# Patient Record
Sex: Female | Born: 1975 | Race: White | Hispanic: No | Marital: Married | Smoking: Current every day smoker
Health system: Southern US, Community
[De-identification: ages and names within clinical notes are randomized; demographics above are authoritative.]

## PROBLEM LIST (undated history)

## (undated) DIAGNOSIS — Z1371 Encounter for nonprocreative screening for genetic disease carrier status: Secondary | ICD-10-CM

## (undated) DIAGNOSIS — J3089 Other allergic rhinitis: Secondary | ICD-10-CM

## (undated) DIAGNOSIS — Z8041 Family history of malignant neoplasm of ovary: Secondary | ICD-10-CM

## (undated) DIAGNOSIS — Z9289 Personal history of other medical treatment: Secondary | ICD-10-CM

## (undated) DIAGNOSIS — L918 Other hypertrophic disorders of the skin: Secondary | ICD-10-CM

## (undated) DIAGNOSIS — N938 Other specified abnormal uterine and vaginal bleeding: Secondary | ICD-10-CM

## (undated) DIAGNOSIS — G5600 Carpal tunnel syndrome, unspecified upper limb: Secondary | ICD-10-CM

## (undated) HISTORY — DX: Other specified abnormal uterine and vaginal bleeding: N93.8

## (undated) HISTORY — DX: Other hypertrophic disorders of the skin: L91.8

## (undated) HISTORY — DX: Encounter for nonprocreative screening for genetic disease carrier status: Z13.71

## (undated) HISTORY — DX: Personal history of other medical treatment: Z92.89

## (undated) HISTORY — PX: WISDOM TOOTH EXTRACTION: SHX21

## (undated) HISTORY — DX: Family history of malignant neoplasm of ovary: Z80.41

---

## 2007-11-03 ENCOUNTER — Ambulatory Visit: Payer: Self-pay | Admitting: Internal Medicine

## 2010-01-27 ENCOUNTER — Emergency Department: Payer: Self-pay | Admitting: Emergency Medicine

## 2011-09-03 ENCOUNTER — Ambulatory Visit: Payer: Self-pay | Admitting: Obstetrics & Gynecology

## 2011-09-03 LAB — CBC WITH DIFFERENTIAL/PLATELET
Basophil #: 0 10*3/uL (ref 0.0–0.1)
Basophil %: 0.4 %
Eosinophil #: 0.1 10*3/uL (ref 0.0–0.7)
Eosinophil %: 0.8 %
HGB: 11.4 g/dL — ABNORMAL LOW (ref 12.0–16.0)
Lymphocyte #: 2 10*3/uL (ref 1.0–3.6)
Lymphocyte %: 21 %
MCH: 26.3 pg (ref 26.0–34.0)
MCHC: 32.6 g/dL (ref 32.0–36.0)
MCV: 81 fL (ref 80–100)
Monocyte #: 0.8 10*3/uL — ABNORMAL HIGH (ref 0.0–0.7)
Neutrophil %: 68.7 %
Platelet: 147 10*3/uL — ABNORMAL LOW (ref 150–440)
RDW: 15.4 % — ABNORMAL HIGH (ref 11.5–14.5)
WBC: 9.3 10*3/uL (ref 3.6–11.0)

## 2011-09-04 ENCOUNTER — Inpatient Hospital Stay: Payer: Self-pay | Admitting: Obstetrics and Gynecology

## 2011-09-05 LAB — HEMATOCRIT: HCT: 30.2 % — ABNORMAL LOW (ref 35.0–47.0)

## 2011-09-24 ENCOUNTER — Emergency Department: Payer: Self-pay | Admitting: Emergency Medicine

## 2012-01-07 ENCOUNTER — Ambulatory Visit: Payer: Self-pay

## 2012-10-28 DIAGNOSIS — Z9289 Personal history of other medical treatment: Secondary | ICD-10-CM

## 2012-10-28 HISTORY — DX: Personal history of other medical treatment: Z92.89

## 2013-03-11 ENCOUNTER — Ambulatory Visit: Payer: Self-pay | Admitting: Family Medicine

## 2013-03-11 LAB — URINALYSIS, COMPLETE
Bilirubin,UR: NEGATIVE
Ketone: NEGATIVE
Specific Gravity: 1.01 (ref 1.003–1.030)

## 2013-04-13 ENCOUNTER — Ambulatory Visit: Payer: Self-pay | Admitting: Family Medicine

## 2013-09-21 ENCOUNTER — Ambulatory Visit: Payer: Self-pay | Admitting: Physician Assistant

## 2013-09-21 LAB — URINALYSIS, COMPLETE
Bilirubin,UR: NEGATIVE
GLUCOSE, UR: NEGATIVE mg/dL (ref 0–75)
KETONE: NEGATIVE
Leukocyte Esterase: NEGATIVE
Nitrite: NEGATIVE
PH: 6 (ref 4.5–8.0)
Protein: NEGATIVE
Specific Gravity: 1.025 (ref 1.003–1.030)
WBC UR: NONE SEEN /HPF (ref 0–5)

## 2013-09-22 LAB — URINE CULTURE

## 2014-03-01 ENCOUNTER — Ambulatory Visit: Payer: Self-pay | Admitting: Physician Assistant

## 2014-07-16 ENCOUNTER — Ambulatory Visit: Payer: Self-pay | Admitting: Physician Assistant

## 2014-10-29 NOTE — Op Note (Signed)
PATIENT NAME:  Sharilyn SitesETERS, Sarika A MR#:  [id number] DATE OF BIRTH:  [birth year]  DATE OF PROCEDURE:  09/04/2011  PREOPERATIVE DIAGNOSIS: Term pregnancy, breech presentation.   POSTOPERATIVE DIAGNOSIS: Term pregnancy, breech presentation.   PROCEDURE PERFORMED: Low transverse cesarean section.   SURGEON: Annamarie MajorPaul Ardith Test, M.D.   ASSISTANT: Kate SablePhilip Rosenow, MD  ANESTHESIA: Spinal.   ESTIMATED BLOOD LOSS: 300 mL.   COMPLICATIONS: None.   FINDINGS: Normal tubes, ovaries, and uterus. Viable female infant weighing 9 pounds, 12 ounces with Apgar scores of 9 and 9 at one and five minutes, respectively.   DISPOSITION: To the recovery room in stable condition.  TECHNIQUE: The patient is prepped and draped in the usual sterile fashion after adequate anesthesia is obtained, in the supine position, on the operating room table. A scalpel is used to create a low transverse skin incision that is dissected down to the level of the rectus fascia. This is then dissected bilaterally using Mayo scissors and then the rectus muscles are dissected away from the fascia and then separated in the midline. The peritoneum is penetrated and the bladder is inferiorly dissected and retracted out of harms way. A scalpel is then used to create a low transverse hysterotomy incision that is then extended by blunt dissection. Amniotomy reveals clear fluid. The infant's buttocks are grasped; it is noted to be in the frank breech presentation. They are carefully ease down and out of the incision until the legs are completely delivered. Gentle traction is applied until the axilla is reached and the arms are carefully and gently swept across the chest and delivered. The head is then easily delivered without any complication. The oropharynx is suctioned, and the infant is handed to the pediatric team.   Cord blood is obtained and the placenta is manually extracted. The uterus is externalized and cleansed of all membranes and debris using a  moist sponge. The hysterotomy incision is closed with a running #1 Vicryl suture in a locking fashion with additional sutures placed to obtain excellent hemostasis. The uterus is then placed back in the intra-abdominal cavity and the paracolic gutters are irrigated with warm saline. Reexamination of the incision reveals excellent hemostasis. Interceed is placed over the incision to prevent adhesions.   The peritoneum is closed. The On-Q pain pump is then placed. Trocars are placed through the skin into the subfascial space where the incision is and silver soaker catheters are then placed with syringes attached for bolusing of bupivacaine at a later time. The rectus fascia is closed with 0 Maxon suture with careful placement of sutures to avoid entrapment of the catheters. The catheters are freely movable after placement of this suture. The subcutaneous tissues are irrigated and hemostasis is assured using electrocautery. Skin is closed with a 4-0 Vicryl suture in a subcuticular fashion followed by placement of Dermabond. Dermabond is also used to stabilize the catheters in place. The catheters are flushed with no leakage points noted with bupivacaine 0.5%, 5 mL in each catheter. It is then hooked up to the pain pump. The catheters are stabilized in place using Steri-Strips and a Tegaderm bandage. The patient goes to the recovery room in stable condition. All sponge, instrument, and needle counts are correct.  ____________________________ R. Annamarie MajorPaul Renaldo Gornick, MD rph:slb D: 09/04/2011 08:50:21 ET T: 09/04/2011 09:18:48 ET JOB#: [id number]  cc: Dierdre Searles. Paul Maylen Waltermire, MD, <Dictator> Nadara MustardOBERT P Kimila Papaleo MD ELECTRONICALLY SIGNED 09/04/2011 14:11

## 2014-10-30 LAB — HM PAP SMEAR: HM PAP: NEGATIVE

## 2014-11-17 HISTORY — PX: OTHER SURGICAL HISTORY: SHX169

## 2014-11-27 DIAGNOSIS — L918 Other hypertrophic disorders of the skin: Secondary | ICD-10-CM

## 2014-11-27 HISTORY — DX: Other hypertrophic disorders of the skin: L91.8

## 2015-03-26 ENCOUNTER — Ambulatory Visit
Admission: EM | Admit: 2015-03-26 | Discharge: 2015-03-26 | Disposition: A | Discharge status: Home / Self Care | Payer: BLUE CROSS/BLUE SHIELD | Attending: Family Medicine | Admitting: Family Medicine

## 2015-03-26 ENCOUNTER — Encounter: Payer: Self-pay | Admitting: Emergency Medicine

## 2015-03-26 DIAGNOSIS — L03113 Cellulitis of right upper limb: Secondary | ICD-10-CM

## 2015-03-26 MED ORDER — CEPHALEXIN 500 MG PO CAPS: 500.0000 mg | ORAL_CAPSULE | Freq: Four times a day (QID) | ORAL | Status: DC

## 2015-03-26 NOTE — ED Provider Notes (Signed)
CSN: [id number]     Arrival date & time 03/26/15  1742 History   First MD Initiated Contact with Patient 03/26/15 1837     Chief Complaint  Patient presents with  . Arm Swelling   (Consider location/radiation/quality/duration/timing/severity/associated sxs/prior Treatment) HPI Comments: 39 yo female with a 2 days h/o redness, swelling and pain to right upper arm skin. States 4 days ago received 2 vaccine injections around that area. Denies any fevers, chills, numbness/tingling, drainage.   The history is provided by the patient.    History reviewed. No pertinent past medical history. Past Surgical History  Procedure Laterality Date  . Cesarean section     History reviewed. No pertinent family history. Social History  Substance Use Topics  . Smoking status: Current Every Day Smoker -- 0.50 packs/day    Types: Cigarettes  . Smokeless tobacco: None  . Alcohol Use: Yes   OB History    No data available     Review of Systems  Allergies  Ciprofloxacin-ciproflox hcl er and Sulfa antibiotics  Home Medications   Prior to Admission medications   Medication Sig Start Date End Date Taking? Authorizing Provider  cephALEXin (KEFLEX) 500 MG capsule Take 1 capsule (500 mg total) by mouth 4 (four) times daily. 03/26/15   Payton Mccallum, MD   Meds Ordered and Administered this Visit  Medications - No data to display  BP 107/67 mmHg  Pulse 107  Temp(Src) 98.9 F (37.2 C) (Tympanic)  Resp 18  Ht  (1.676 m)  Wt 143 lb (64.864 kg)  BMI 23.09 kg/m2  SpO2 100% No data found.   Physical Exam  Constitutional: She appears well-developed and well-nourished. No distress.  Neurological: She is alert.  Skin: Rash noted. She is not diaphoretic. There is erythema.  Right upper extremity (mid-upper arm; below the shoulder) skin with blanchable erythema, warmth, and tenderness to palpation; area measures 15x13cm; no drainge or skin lesions/masses; extremity neurovascularly intact   Nursing note and vitals reviewed.   ED Course  Procedures (including critical care time)  Labs Review Labs Reviewed - No data to display  Imaging Review No results found.   Visual Acuity Review  Right Eye Distance:   Left Eye Distance:   Bilateral Distance:    Right Eye Near:   Left Eye Near:    Bilateral Near:         MDM   1. Cellulitis of right upper extremity    Discharge Medication List as of 03/26/2015  6:48 PM    START taking these medications   Details  cephALEXin (KEFLEX) 500 MG capsule Take 1 capsule (500 mg total) by mouth 4 (four) times daily., Starting 03/26/2015, Until Discontinued, Normal      Plan: 1. diagnosis reviewed with patient 2. rx as per orders; risks, benefits, potential side effects reviewed with patient 3. Recommend supportive treatment with warm compresses to affected area 4. F/u prn if symptoms worsen or don't improve    Payton Mccallum, MD 03/26/15 1859

## 2015-03-26 NOTE — ED Notes (Signed)
Patient states she got a pneumonia and flu shot on Friday in her upper right arm and it is swollen, red and painful.

## 2015-03-26 NOTE — Discharge Instructions (Signed)

## 2015-11-20 ENCOUNTER — Other Ambulatory Visit: Payer: Self-pay | Admitting: Obstetrics & Gynecology

## 2015-11-20 DIAGNOSIS — Z1231 Encounter for screening mammogram for malignant neoplasm of breast: Secondary | ICD-10-CM

## 2016-11-03 ENCOUNTER — Ambulatory Visit (INDEPENDENT_AMBULATORY_CARE_PROVIDER_SITE_OTHER): Payer: BLUE CROSS/BLUE SHIELD | Admitting: Obstetrics & Gynecology

## 2016-11-03 ENCOUNTER — Encounter: Payer: Self-pay | Admitting: Obstetrics & Gynecology

## 2016-11-03 ENCOUNTER — Telehealth: Payer: Self-pay | Admitting: Obstetrics & Gynecology

## 2016-11-03 VITALS — BP 110/60 | HR 95 | Ht 66.0 in | Wt 147.0 lb

## 2016-11-03 DIAGNOSIS — Z0000 Encounter for general adult medical examination without abnormal findings: Secondary | ICD-10-CM | POA: Diagnosis not present

## 2016-11-03 DIAGNOSIS — Z3009 Encounter for other general counseling and advice on contraception: Secondary | ICD-10-CM | POA: Diagnosis not present

## 2016-11-03 DIAGNOSIS — Z1231 Encounter for screening mammogram for malignant neoplasm of breast: Secondary | ICD-10-CM

## 2016-11-03 DIAGNOSIS — N921 Excessive and frequent menstruation with irregular cycle: Secondary | ICD-10-CM

## 2016-11-03 DIAGNOSIS — Z1239 Encounter for other screening for malignant neoplasm of breast: Secondary | ICD-10-CM

## 2016-11-03 NOTE — Telephone Encounter (Signed)
Correction: [phone] has been disconnected.

## 2016-11-03 NOTE — Patient Instructions (Signed)
Endometrial Ablation Endometrial ablation is a procedure that destroys the thin inner layer of the lining of the uterus (endometrium). This procedure may be done:  To stop heavy periods.  To stop bleeding that is causing anemia.  To control irregular bleeding.  To treat bleeding caused by small tumors (fibroids) in the endometrium.  This procedure is often an alternative to major surgery, such as removal of the uterus and cervix (hysterectomy). As a result of this procedure:  You may not be able to have children. However, if you are premenopausal (you have not gone through menopause): ? You may still have a small chance of getting pregnant. ? You will need to use a reliable method of birth control after the procedure to prevent pregnancy.  You may stop having a menstrual period, or you may have only a small amount of bleeding during your period. Menstruation may return several years after the procedure.  Tell a health care provider about:  Any allergies you have.  All medicines you are taking, including vitamins, herbs, eye drops, creams, and over-the-counter medicines.  Any problems you or family members have had with the use of anesthetic medicines.  Any blood disorders you have.  Any surgeries you have had.  Any medical conditions you have. What are the risks? Generally, this is a safe procedure. However, problems may occur, including:  A hole (perforation) in the uterus or bowel.  Infection of the uterus, bladder, or vagina.  Bleeding.  Damage to other structures or organs.  An air bubble in the lung (air embolus).  Problems with pregnancy after the procedure.  Failure of the procedure.  Decreased ability to diagnose cancer in the endometrium.  What happens before the procedure?  You will have tests of your endometrium to make sure there are no pre-cancerous cells or cancer cells present.  You may have an ultrasound of the uterus.  You may be given  medicines to thin the endometrium.  Ask your health care provider about: ? Changing or stopping your regular medicines. This is especially important if you take diabetes medicines or blood thinners. ? Taking medicines such as aspirin and ibuprofen. These medicines can thin your blood. Do not take these medicines before your procedure if your doctor tells you not to.  Plan to have someone take you home from the hospital or clinic. What happens during the procedure?  You will lie on an exam table with your feet and legs supported as in a pelvic exam.  To lower your risk of infection: ? Your health care team will wash or sanitize their hands and put on germ-free (sterile) gloves. ? Your genital area will be washed with soap.  An IV tube will be inserted into one of your veins.  You will be given a medicine to help you relax (sedative).  A surgical instrument with a light and camera (resectoscope) will be inserted into your vagina and moved into your uterus. This allows your surgeon to see inside your uterus.  Endometrial tissue will be removed using one of the following methods: ? Radiofrequency. This method uses a radiofrequency-alternating electric current to remove the endometrium. ? Cryotherapy. This method uses extreme cold to freeze the endometrium. ? Heated-free liquid. This method uses a heated saltwater (saline) solution to remove the endometrium. ? Microwave. This method uses high-energy microwaves to heat up the endometrium and remove it. ? Thermal balloon. This method involves inserting a catheter with a balloon tip into the uterus. The balloon tip is   filled with heated fluid to remove the endometrium. The procedure may vary among health care providers and hospitals. What happens after the procedure?  Your blood pressure, heart rate, breathing rate, and blood oxygen level will be monitored until the medicines you were given have worn off.  As tissue healing occurs, you may  notice vaginal bleeding for 4-6 weeks after the procedure. You may also experience: ? Cramps. ? Thin, watery vaginal discharge that is light pink or brown in color. ? A need to urinate more frequently than usual. ? Nausea.  Do not drive for 24 hours if you were given a sedative.  Do not have sex or insert anything into your vagina until your health care provider approves. Summary  Endometrial ablation is done to treat the many causes of heavy menstrual bleeding.  The procedure may be done only after medications have been tried to control the bleeding.  Plan to have someone take you home from the hospital or clinic. This information is not intended to replace advice given to you by your health care provider. Make sure you discuss any questions you have with your health care provider. Document Released: 05/02/2004 Document Revised: 07/10/2016 Document Reviewed: 07/10/2016 Elsevier Interactive Patient Education  2017 [employer]  

## 2016-11-03 NOTE — Progress Notes (Signed)
HPI:      [name] is a 41 y.o. G1P1001 who LMP was Patient's last menstrual period was 10/06/2016., she presents today for her annual examination. The patient has no complaints today. The patient is sexually active. Her 2016 last pap: was normal. The patient does perform self breast exams.  Last MMG 2016 normal.  There is no notable family history of breast or ovarian cancer in her family.  The patient has regular exercise: yes.  The patient denies current symptoms of depression.    GYN History: Contraception: oral progesterone-only contraceptive  PMHx: No past medical history on file. Past Surgical History:  Procedure Laterality Date  . CESAREAN SECTION     Family History  Problem Relation Age of Onset  . Throat cancer Father    Social History  Substance Use Topics  . Smoking status: Current Every Day Smoker    Packs/day: 0.50    Types: Cigarettes  . Smokeless tobacco: Never Used  . Alcohol use Yes    Current Outpatient Prescriptions:  .  calcium carbonate 100 mg/ml SUSP, Take by mouth., Disp: , Rfl:  .  CAMILA 0.35 MG tablet, , Disp: , Rfl:  .  cephALEXin (KEFLEX) 500 MG capsule, Take 1 capsule (500 mg total) by mouth 4 (four) times daily., Disp: 40 capsule, Rfl: 0 Allergies: Azelastine; Ciprofloxacin-ciproflox hcl er; Isosorbide dinitrate er; Sulfa antibiotics; and Ciprofloxacin  Review of Systems  Constitutional: Negative for chills, fever and malaise/fatigue.  HENT: Negative for congestion, sinus pain and sore throat.   Eyes: Negative for blurred vision and pain.  Respiratory: Negative for cough and wheezing.   Cardiovascular: Negative for chest pain and leg swelling.  Gastrointestinal: Negative for abdominal pain, constipation, diarrhea, heartburn, nausea and vomiting.  Genitourinary: Negative for dysuria, frequency, hematuria and urgency.  Musculoskeletal: Negative for back pain, joint pain, myalgias and neck pain.  Skin: Negative for itching and  rash.  Neurological: Negative for dizziness, tremors and weakness.  Endo/Heme/Allergies: Does not bruise/bleed easily.  Psychiatric/Behavioral: Negative for depression. The patient is not nervous/anxious and does not have insomnia.     Objective: BP 110/60   Pulse 95   Ht  (1.676 m)   Wt 147 lb (66.7 kg)   LMP 10/06/2016   BMI 23.73 kg/m   Filed Weights   11/03/16 0807  Weight: 147 lb (66.7 kg)   Body mass index is 23.73 kg/m. Physical Exam  Constitutional: She is oriented to person, place, and time. She appears well-developed and well-nourished. No distress.  Genitourinary: Rectum normal, vagina normal and uterus normal. Pelvic exam was performed with patient supine. There is no rash or lesion on the right labia. There is no rash or lesion on the left labia. Vagina exhibits no lesion. No bleeding in the vagina. Right adnexum does not display mass and does not display tenderness. Left adnexum does not display mass and does not display tenderness. Cervix does not exhibit motion tenderness, lesion, friability or polyp.   Uterus is mobile and midaxial. Uterus is not enlarged or exhibiting a mass.  HENT:  Head: Normocephalic and atraumatic. Head is without laceration.  Right Ear: Hearing normal.  Left Ear: Hearing normal.  Nose: No epistaxis.  No foreign bodies.  Mouth/Throat: Uvula is midline, oropharynx is clear and moist and mucous membranes are normal.  Eyes: Pupils are equal, round, and reactive to light.  Neck: Normal range of motion. Neck supple. No thyromegaly present.  Cardiovascular: Normal rate and regular rhythm.  Exam reveals no gallop and no friction rub.   No murmur heard. Pulmonary/Chest: Effort normal and breath sounds normal. No respiratory distress. She has no wheezes. Right breast exhibits no mass, no skin change and no tenderness. Left breast exhibits no mass, no skin change and no tenderness.  Abdominal: Soft. Bowel sounds are normal. She exhibits no  distension. There is no tenderness. There is no rebound.  Musculoskeletal: Normal range of motion.  Neurological: She is alert and oriented to person, place, and time. No cranial nerve deficit.  Skin: Skin is warm and dry.  Psychiatric: She has a normal mood and affect. Judgment normal.  Vitals reviewed.   Assessment:  ANNUAL EXAM 1. Annual physical exam   2. Consultation for female sterilization   3. Menometrorrhagia   4. Screening for breast cancer      Screening Plan:            1.  Cervical Screening-  Pap smear schedule reviewed with patient, due 2019  2. Breast screening- Exam annually and mammogram>40 planned   3. Colonoscopy every 10 years, Hemoccult testing - after age 54  4. Labs 2016 (and chol repeat 2017) normal/ UTD  5. Counseling for contraception: bilateral tubal ligation,Cont pill for now until procedure  Other:   1. Annual physical exam  2. Consultation for female sterilization Desires BTL along w ablation  3. Menometrorrhagia Treatment option for menorrhagia or menometrorrhagia discussed in great detail with the patient.  Options include hormonal therapy, IUD therapy such as Mirena, D&C, Ablation, and Hysterectomy.  The pros and cons of each option discussed with patient.    F/U  Return in about 1 year (around 11/03/2017) for Annual.  Annamarie Major, MD, [facility], [facility] 11/03/2016  8:42 AM

## 2016-11-03 NOTE — Telephone Encounter (Signed)
-----   Message from Nadara Mustard, MD sent at 11/03/2016  8:44 AM EDT ----- Regarding: surgery Surgery Booking Request Patient Full Name: MARIETA, MARKOV MRN: [id number]  DOB: [birth year]  Surgeon: Letitia Libra, MD  Requested Surgery Date and Time: June 5 Primary Diagnosis and Code: Menometrorrhagia, Desire for sterility Secondary Diagnosis and Code:  Surgical Procedure: Lap Salpingectomy, Hysteroscopy D&C, ABlation L&D Notification: No Admission Status: same day surgery Length of Surgery: 30 Special Case Needs: Novasure H&P:   (date) Phone Interview or Office Pre-Admit: yes Interpreter: no Language: no Medical Clearance: no Special Scheduling Instructions: no

## 2016-11-03 NOTE — Telephone Encounter (Signed)
I attempted to reach the patient, but the phone# listed on her account, [phone], has been disconnected.

## 2016-11-05 NOTE — Telephone Encounter (Signed)
[phone] is still disconnected.

## 2016-11-06 NOTE — Telephone Encounter (Signed)
[phone] is still disconnected.

## 2016-11-06 NOTE — Telephone Encounter (Signed)
Patient's phone# was incorrectly listed with a [phone]. Patient's phone# is [phone]. Patient is aware of H&P on 12/03/16 @ 8:40am, Pre-admit Testing phone interview afterwards, and OR on 12/09/16.

## 2016-11-11 ENCOUNTER — Other Ambulatory Visit: Payer: Self-pay | Admitting: Obstetrics & Gynecology

## 2016-11-11 DIAGNOSIS — Z30011 Encounter for initial prescription of contraceptive pills: Secondary | ICD-10-CM

## 2016-12-03 ENCOUNTER — Ambulatory Visit (INDEPENDENT_AMBULATORY_CARE_PROVIDER_SITE_OTHER): Payer: BLUE CROSS/BLUE SHIELD | Admitting: Obstetrics & Gynecology

## 2016-12-03 ENCOUNTER — Encounter: Payer: Self-pay | Admitting: Obstetrics & Gynecology

## 2016-12-03 ENCOUNTER — Encounter
Admission: RE | Admit: 2016-12-03 | Discharge: 2016-12-03 | Disposition: A | Discharge status: Home / Self Care | Payer: BLUE CROSS/BLUE SHIELD | Source: Ambulatory Visit | Attending: Obstetrics & Gynecology | Admitting: Obstetrics & Gynecology

## 2016-12-03 VITALS — BP 100/60 | HR 82 | Ht 66.0 in | Wt 148.0 lb

## 2016-12-03 DIAGNOSIS — N920 Excessive and frequent menstruation with regular cycle: Secondary | ICD-10-CM

## 2016-12-03 DIAGNOSIS — Z302 Encounter for sterilization: Secondary | ICD-10-CM

## 2016-12-03 HISTORY — DX: Carpal tunnel syndrome, unspecified upper limb: G56.00

## 2016-12-03 NOTE — Patient Instructions (Signed)
  Your procedure is scheduled on: 12-09-16 Report to Same Day Surgery 2nd floor medical mall [facility](Medical Mall Entrance-take elevator on left to 2nd floor.  Check in with surgery information desk.) To find out your arrival time please call [phone] between 1PM - 3PM on 12-08-16  Remember: Instructions that are not followed completely may result in serious medical risk, up to and including death, or upon the discretion of your surgeon and anesthesiologist your surgery may need to be rescheduled.    _x___ 1. Do not eat food or drink liquids after midnight. No gum chewing or   hard candies.     __x__ 2. No Alcohol for 24 hours before or after surgery.   __x__3. No Smoking for 24 prior to surgery.   ____  4. Bring all medications with you on the day of surgery if instructed.    __x__ 5. Notify your doctor if there is any change in your medical condition     (cold, fever, infections).     Do not wear jewelry, make-up, hairpins, clips or nail polish.  Do not wear lotions, powders, or perfumes. You may wear deodorant.  Do not shave 48 hours prior to surgery. Men may shave face and neck.  Do not bring valuables to the hospital.    [facility] is not responsible for any belongings or valuables.               Contacts, dentures or bridgework may not be worn into surgery.  Leave your suitcase in the car. After surgery it may be brought to your room.  For patients admitted to the hospital, discharge time is determined by yourtreatment team.   Patients discharged the day of surgery will not be allowed to drive home.  You will need someone to drive you home and stay with you the night of your procedure.    Please read over the following fact sheets that you were given:   [facility] and or MRSA Information   _x___ Take anti-hypertensive (unless it includes a diuretic), cardiac, seizure, asthma,     anti-reflux and psychiatric medicines. These include:  1.  ZYRTEC  2.  3.  4.  5.  6.  ____Fleets enema or Magnesium Citrate as directed.   ____ Use CHG Soap or sage wipes as directed on instruction sheet   ____ Use inhalers on the day of surgery and bring to hospital day of surgery  ____ Stop Metformin and Janumet 2 days prior to surgery.    ____ Take 1/2 of usual insulin dose the night before surgery and none on the morning  surgery.   ____ Follow recommendations from Cardiologist, Pulmonologist or PCP regarding  stopping Aspirin, Coumadin, Pllavix ,Eliquis, Effient, or Pradaxa, and Pletal.  X____Stop Anti-inflammatories such as Advil, Aleve, Ibuprofen, Motrin, Naproxen, Naprosyn, Goodies powders or aspirin products NOW-OK to take Tylenol.   _x___ Stop supplements until after surgery-STOP [place] MULTI-VITAMIN PACK NOW   ____ Bring C-Pap to the hospital.

## 2016-12-03 NOTE — Patient Instructions (Signed)
Laparoscopic Tubal Ligation Laparoscopic tubal ligation is a procedure to close the fallopian tubes. This is done so that you cannot get pregnant. When the fallopian tubes are closed, the eggs that your ovaries release cannot enter the uterus, and sperm cannot reach the released eggs. A laparoscopic tubal ligation is sometimes called "getting your tubes tied." You should not have this procedure if you want to get pregnant someday or if you are unsure about having more children. Tell a health care provider about:  Any allergies you have.  All medicines you are taking, including vitamins, herbs, eye drops, creams, and over-the-counter medicines.  Any problems you or family members have had with anesthetic medicines.  Any blood disorders you have.  Any surgeries you have had.  Any medical conditions you have.  Whether you are pregnant or may be pregnant.  Any past pregnancies. What are the risks? Generally, this is a safe procedure. However, problems may occur, including:  Infection.  Bleeding.  Injury to surrounding organs.  Side effects from anesthetics.  Failure of the procedure. This procedure can increase your risk of a kind of pregnancy in which a fertilized egg attaches to the outside of the uterus (ectopic pregnancy). What happens before the procedure?  Ask your health care provider about:  Changing or stopping your regular medicines. This is especially important if you are taking diabetes medicines or blood thinners.  Taking medicines such as aspirin and ibuprofen. These medicines can thin your blood. Do not take these medicines before your procedure if your health care provider instructs you not to.  Follow instructions from your health care provider about eating and drinking restrictions.  Plan to have someone take you home after the procedure.  If you go home right after the procedure, plan to have someone with you for 24 hours. What happens during the  procedure?  You will be given one or more of the following:  A medicine to help you relax (sedative).  A medicine to numb the area (local anesthetic).  A medicine to make you fall asleep (general anesthetic).  A medicine that is injected into an area of your body to numb everything below the injection site (regional anesthetic).  An IV tube will be inserted into one of your veins. It will be used to give you medicines and fluids during the procedure.  Your bladder may be emptied with a small tube (catheter).  If you have been given a general anesthetic, a tube will be put down your throat to help you breathe.  Two small cuts (incisions) will be made in your lower abdomen and near your belly button.  Your abdomen will be inflated with a gas. This will let the surgeon see better and will give the surgeon room to work.  A thin, lighted tube (laparoscope) with a camera attached will be inserted into your abdomen through one of the incisions. Small instruments will be inserted through the other incision.  The fallopian tubes will be tied off, burned (cauterized), or blocked with a clip, ring, or clamp. A small portion in the center of each fallopian tube may be removed.  The gas will be released from the abdomen.  The incisions will be closed with stitches (sutures).  A bandage (dressing) will be placed over the incisions. The procedure may vary among health care providers and hospitals. What happens after the procedure?  Your blood pressure, heart rate, breathing rate, and blood oxygen level will be monitored often until the medicines you were   given have worn off.  You will be given medicine to help with pain, nausea, and vomiting as needed. This information is not intended to replace advice given to you by your health care provider. Make sure you discuss any questions you have with your health care provider. Document Released: 09/29/2000 Document Revised: 11/29/2015 Document  Reviewed: 06/03/2015 Elsevier Interactive Patient Education  2017 [employer] Endometrial Ablation Endometrial ablation is a procedure that destroys the thin inner layer of the lining of the uterus (endometrium). This procedure may be done:  To stop heavy periods.  To stop bleeding that is causing anemia.  To control irregular bleeding.  To treat bleeding caused by small tumors (fibroids) in the endometrium. This procedure is often an alternative to major surgery, such as removal of the uterus and cervix (hysterectomy). As a result of this procedure:  You may not be able to have children. However, if you are premenopausal (you have not gone through menopause):  You may still have a small chance of getting pregnant.  You will need to use a reliable method of birth control after the procedure to prevent pregnancy.  You may stop having a menstrual period, or you may have only a small amount of bleeding during your period. Menstruation may return several years after the procedure. Tell a health care provider about:  Any allergies you have.  All medicines you are taking, including vitamins, herbs, eye drops, creams, and over-the-counter medicines.  Any problems you or family members have had with the use of anesthetic medicines.  Any blood disorders you have.  Any surgeries you have had.  Any medical conditions you have. What are the risks? Generally, this is a safe procedure. However, problems may occur, including:  A hole (perforation) in the uterus or bowel.  Infection of the uterus, bladder, or vagina.  Bleeding.  Damage to other structures or organs.  An air bubble in the lung (air embolus).  Problems with pregnancy after the procedure.  Failure of the procedure.  Decreased ability to diagnose cancer in the endometrium. What happens before the procedure?  You will have tests of your endometrium to make sure there are no pre-cancerous cells or cancer cells  present.  You may have an ultrasound of the uterus.  You may be given medicines to thin the endometrium.  Ask your health care provider about:  Changing or stopping your regular medicines. This is especially important if you take diabetes medicines or blood thinners.  Taking medicines such as aspirin and ibuprofen. These medicines can thin your blood. Do not take these medicines before your procedure if your doctor tells you not to.  Plan to have someone take you home from the hospital or clinic. What happens during the procedure?  You will lie on an exam table with your feet and legs supported as in a pelvic exam.  To lower your risk of infection:  Your health care team will wash or sanitize their hands and put on germ-free (sterile) gloves.  Your genital area will be washed with soap.  An IV tube will be inserted into one of your veins.  You will be given a medicine to help you relax (sedative).  A surgical instrument with a light and camera (resectoscope) will be inserted into your vagina and moved into your uterus. This allows your surgeon to see inside your uterus.  Endometrial tissue will be removed using one of the following methods:  Radiofrequency. This method uses a radiofrequency-alternating electric current to remove  the endometrium.  Cryotherapy. This method uses extreme cold to freeze the endometrium.  Heated-free liquid. This method uses a heated saltwater (saline) solution to remove the endometrium.  Microwave. This method uses high-energy microwaves to heat up the endometrium and remove it.  Thermal balloon. This method involves inserting a catheter with a balloon tip into the uterus. The balloon tip is filled with heated fluid to remove the endometrium. The procedure may vary among health care providers and hospitals. What happens after the procedure?  Your blood pressure, heart rate, breathing rate, and blood oxygen level will be monitored until the  medicines you were given have worn off.  As tissue healing occurs, you may notice vaginal bleeding for 4-6 weeks after the procedure. You may also experience:  Cramps.  Thin, watery vaginal discharge that is light pink or brown in color.  A need to urinate more frequently than usual.  Nausea.  Do not drive for 24 hours if you were given a sedative.  Do not have sex or insert anything into your vagina until your health care provider approves. Summary  Endometrial ablation is done to treat the many causes of heavy menstrual bleeding.  The procedure may be done only after medications have been tried to control the bleeding.  Plan to have someone take you home from the hospital or clinic. This information is not intended to replace advice given to you by your health care provider. Make sure you discuss any questions you have with your health care provider. Document Released: 05/02/2004 Document Revised: 07/10/2016 Document Reviewed: 07/10/2016 Elsevier Interactive Patient Education  2017 [employer]

## 2016-12-03 NOTE — Progress Notes (Signed)
PRE-OPERATIVE HISTORY AND PHYSICAL EXAM  HPI:  Deanna Torres is a 41 y.o. G2P1011 No LMP recorded.; she is being admitted for surgery related to abnormal uterine bleeding and requested sterilization.  Periods are reg and heavy, BTB, painful. OCP no help.  Smoker.  Does not desire any further pregnancies.  PMHx: Past Medical History:  Diagnosis Date  . Dysfunctional uterine bleeding   . 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 Hx of mammogram 10/28/2012   Baseline Birad 1   Past Surgical History:  Procedure Laterality Date  . CESAREAN SECTION    . OTHER SURGICAL HISTORY  11/17/2014   vaginal biopsy (fibroepitheal polyp)   Family History  Problem Relation Age of Onset  . Throat cancer Father   . CVA Father   . Lung cancer Father    Social History  Substance Use Topics  . Smoking status: Current Every Day Smoker    Packs/day: 0.50    Types: Cigarettes  . Smokeless tobacco: Never Used  . Alcohol use Yes  Meds- Camila Allergies: Azelastine; Ciprofloxacin-ciproflox hcl er; Isosorbide dinitrate er; Sulfa antibiotics; and Ciprofloxacin  Review of Systems  Constitutional: Negative for chills, fever and malaise/fatigue.  HENT: Negative for congestion, sinus pain and sore throat.   Eyes: Negative for blurred vision and pain.  Respiratory: Negative for cough and wheezing.   Cardiovascular: Negative for chest pain and leg swelling.  Gastrointestinal: Negative for abdominal pain, constipation, diarrhea, heartburn, nausea and vomiting.  Genitourinary: Negative for dysuria, frequency, hematuria and urgency.  Musculoskeletal: Negative for back pain, joint pain, myalgias and neck pain.  Skin: Negative for itching and rash.  Neurological: Negative for dizziness, tremors and weakness.  Endo/Heme/Allergies: Does not bruise/bleed easily.  Psychiatric/Behavioral: Negative for depression. The patient is not nervous/anxious and does not have insomnia.     Objective: BP 100/60   Pulse  82   Ht 5\' 6"  (1.676 m)   Wt 148 lb (67.1 kg)   BMI 23.89 kg/m   Filed Weights   12/03/16 2956  Weight: 148 lb (67.1 kg)   Physical Exam  Constitutional: She is oriented to person, place, and time. She appears well-developed and well-nourished. No distress.  Genitourinary: Rectum normal, vagina normal and uterus normal. Pelvic exam was performed with patient supine. There is no rash or lesion on the right labia. There is no rash or lesion on the left labia. Vagina exhibits no lesion. No bleeding in the vagina. Right adnexum does not display mass and does not display tenderness. Left adnexum does not display mass and does not display tenderness. Cervix does not exhibit motion tenderness, lesion, friability or polyp.   Uterus is mobile and midaxial. Uterus is not enlarged or exhibiting a mass.  HENT:  Head: Normocephalic and atraumatic. Head is without laceration.  Right Ear: Hearing normal.  Left Ear: Hearing normal.  Nose: No epistaxis.  No foreign bodies.  Mouth/Throat: Uvula is midline, oropharynx is clear and moist and mucous membranes are normal.  Eyes: Pupils are equal, round, and reactive to light.  Neck: Normal range of motion. Neck supple. No thyromegaly present.  Cardiovascular: Normal rate and regular rhythm.  Exam reveals no gallop and no friction rub.   No murmur heard. Pulmonary/Chest: Effort normal and breath sounds normal. No respiratory distress. She has no wheezes. Right breast exhibits no mass, no skin change and no tenderness. Left breast exhibits no mass, no skin change and no tenderness.  Abdominal: Soft. Bowel sounds are normal. She exhibits  no distension. There is no tenderness. There is no rebound.  Musculoskeletal: Normal range of motion.  Neurological: She is alert and oriented to person, place, and time. No cranial nerve deficit.  Skin: Skin is warm and dry.  Psychiatric: She has a normal mood and affect. Judgment normal.  Vitals reviewed.   Assessment: 1.  Menorrhagia with regular cycle   2. Admission for sterilization   Plan Lap BTL and Hyst D&C w Ablation  Patient was told that it is normal to have menstrual bleeding after an endometrial ablation, only about 40% of patients become amenorrheic, 50% of patients have normal or light periods, and 10% of patients have no change in their bleeding pattern and may need further intervention.  She was told she will observe her periods for a few months after her ablation to see what her periods will be like; it is recommended to wait until at least three months after the procedure before making conclusions about how periods are going to be like after an ablation.  The patient has been fully informed about all methods of contraception, both temporary and permanent. She understands that tubal ligation is meant to be permanent, absolute and irreversible. She was told that there is an approximately 1 in 400 chance of a pregnancy in the future after tubal ligation. She was told the short and long term complications of tubal ligation. She understands the risks from this surgery include, but are not limited to, the risks of anesthesia, hemorrhage, infection, perforation, and injury to adjacent structures, bowel, bladder and blood vessels.   I have had a careful discussion with this patient about all the options available and the risk/benefits of each. I have fully informed this patient that surgery may subject her to a variety of discomforts and risks: She understands that most patients have surgery with little difficulty, but problems can happen ranging from minor to fatal. These include nausea, vomiting, pain, bleeding, infection, poor healing, hernia, or formation of adhesions. Unexpected reactions may occur from any drug or anesthetic given. Unintended injury may occur to other pelvic or abdominal structures such as Fallopian tubes, ovaries, bladder, ureter (tube from kidney to bladder), or bowel. Nerves going from the  pelvis to the legs may be injured. Any such injury may require immediate or later additional surgery to correct the problem. Excessive blood loss requiring transfusion is very unlikely but possible. Dangerous blood clots may form in the legs or lungs. Physical and sexual activity will be restricted in varying degrees for an indeterminate period of time but most often 2-6 weeks.  Finally, she understands that it is impossible to list every possible undesirable effect and that the condition for which surgery is done is not always cured or significantly improved, and in rare cases may be even worse.Ample time was given to answer all questions.  Annamarie MajorPaul Harris, MD, Merlinda FrederickFACOG [facility], [facility] 12/03/2016  8:31 AM

## 2016-12-09 ENCOUNTER — Ambulatory Visit
Admission: RE | Admit: 2016-12-09 | Discharge: 2016-12-09 | Disposition: A | Discharge status: Home / Self Care | Payer: BLUE CROSS/BLUE SHIELD | Source: Ambulatory Visit | Attending: Obstetrics & Gynecology | Admitting: Obstetrics & Gynecology

## 2016-12-09 ENCOUNTER — Ambulatory Visit: Payer: BLUE CROSS/BLUE SHIELD | Admitting: Anesthesiology

## 2016-12-09 ENCOUNTER — Encounter: Payer: Self-pay | Admitting: Anesthesiology

## 2016-12-09 ENCOUNTER — Encounter
Admission: RE | Disposition: A | Discharge status: Home / Self Care | Payer: Self-pay | Source: Ambulatory Visit | Attending: Obstetrics & Gynecology

## 2016-12-09 DIAGNOSIS — Z302 Encounter for sterilization: Secondary | ICD-10-CM | POA: Insufficient documentation

## 2016-12-09 DIAGNOSIS — F17200 Nicotine dependence, unspecified, uncomplicated: Secondary | ICD-10-CM | POA: Diagnosis not present

## 2016-12-09 DIAGNOSIS — N920 Excessive and frequent menstruation with regular cycle: Secondary | ICD-10-CM | POA: Insufficient documentation

## 2016-12-09 DIAGNOSIS — N838 Other noninflammatory disorders of ovary, fallopian tube and broad ligament: Secondary | ICD-10-CM | POA: Insufficient documentation

## 2016-12-09 HISTORY — PX: LAPAROSCOPIC BILATERAL SALPINGECTOMY: SHX5889

## 2016-12-09 HISTORY — PX: DILITATION & CURRETTAGE/HYSTROSCOPY WITH NOVASURE ABLATION: SHX5568

## 2016-12-09 LAB — CBC
HCT: 42 % (ref 35.0–47.0)
Hemoglobin: 14.1 g/dL (ref 12.0–16.0)
MCH: 27.4 pg (ref 26.0–34.0)
MCHC: 33.5 g/dL (ref 32.0–36.0)
MCV: 81.7 fL (ref 80.0–100.0)
PLATELETS: 224 10*3/uL (ref 150–440)
RBC: 5.14 MIL/uL (ref 3.80–5.20)
RDW: 14.3 % (ref 11.5–14.5)
WBC: 8.4 10*3/uL (ref 3.6–11.0)

## 2016-12-09 LAB — TYPE AND SCREEN
ABO/RH(D): A POS
Antibody Screen: NEGATIVE

## 2016-12-09 LAB — POCT PREGNANCY, URINE: PREG TEST UR: NEGATIVE

## 2016-12-09 SURGERY — SALPINGECTOMY, BILATERAL, LAPAROSCOPIC
Anesthesia: General | Site: Uterus | Wound class: Clean

## 2016-12-09 MED ORDER — SUCCINYLCHOLINE CHLORIDE 20 MG/ML IJ SOLN
INTRAMUSCULAR | Status: DC | PRN
  Administered 2016-12-09: 80 mg via INTRAVENOUS

## 2016-12-09 MED ORDER — FENTANYL CITRATE (PF) 100 MCG/2ML IJ SOLN
INTRAMUSCULAR | Status: AC
  Filled 2016-12-09: qty 2

## 2016-12-09 MED ORDER — FENTANYL CITRATE (PF) 100 MCG/2ML IJ SOLN
INTRAMUSCULAR | Status: DC | PRN
  Administered 2016-12-09: 50 ug via INTRAVENOUS
  Administered 2016-12-09: 100 ug via INTRAVENOUS

## 2016-12-09 MED ORDER — BUPIVACAINE HCL (PF) 0.5 % IJ SOLN
INTRAMUSCULAR | Status: DC | PRN
  Administered 2016-12-09: 8 mL

## 2016-12-09 MED ORDER — LACTATED RINGERS IV SOLN
INTRAVENOUS | Status: DC
  Administered 2016-12-09: 10:00:00 via INTRAVENOUS

## 2016-12-09 MED ORDER — LIDOCAINE HCL (CARDIAC) 20 MG/ML IV SOLN
INTRAVENOUS | Status: DC | PRN
  Administered 2016-12-09: 50 mg via INTRAVENOUS

## 2016-12-09 MED ORDER — ACETAMINOPHEN 325 MG PO TABS: 650.0000 mg | ORAL_TABLET | ORAL | Status: DC | PRN

## 2016-12-09 MED ORDER — ONDANSETRON HCL 4 MG/2ML IJ SOLN
INTRAMUSCULAR | Status: DC | PRN
  Administered 2016-12-09: 4 mg via INTRAVENOUS

## 2016-12-09 MED ORDER — FENTANYL CITRATE (PF) 100 MCG/2ML IJ SOLN
25.0000 ug | INTRAMUSCULAR | Status: DC | PRN
  Administered 2016-12-09 (×4): 25 ug via INTRAVENOUS

## 2016-12-09 MED ORDER — LIDOCAINE HCL (PF) 2 % IJ SOLN
INTRAMUSCULAR | Status: AC
  Filled 2016-12-09: qty 2

## 2016-12-09 MED ORDER — BUPIVACAINE HCL (PF) 0.5 % IJ SOLN
INTRAMUSCULAR | Status: AC
  Filled 2016-12-09: qty 30

## 2016-12-09 MED ORDER — ONDANSETRON HCL 4 MG/2ML IJ SOLN
INTRAMUSCULAR | Status: AC
  Filled 2016-12-09: qty 2

## 2016-12-09 MED ORDER — OXYCODONE-ACETAMINOPHEN 5-325 MG PO TABS: 1.0000 | ORAL_TABLET | ORAL | 0 refills | Status: DC | PRN

## 2016-12-09 MED ORDER — KETOROLAC TROMETHAMINE 30 MG/ML IJ SOLN: 30.0000 mg | Freq: Four times a day (QID) | INTRAMUSCULAR | Status: DC

## 2016-12-09 MED ORDER — PROPOFOL 10 MG/ML IV BOLUS
INTRAVENOUS | Status: AC
  Filled 2016-12-09: qty 20

## 2016-12-09 MED ORDER — LACTATED RINGERS IR SOLN
Status: DC | PRN
  Administered 2016-12-09: 1000 mL

## 2016-12-09 MED ORDER — PROPOFOL 10 MG/ML IV BOLUS
INTRAVENOUS | Status: DC | PRN
Start: 2016-12-09 — End: 2016-12-09
  Administered 2016-12-09: 150 mg via INTRAVENOUS

## 2016-12-09 MED ORDER — SUGAMMADEX SODIUM 200 MG/2ML IV SOLN
INTRAVENOUS | Status: DC | PRN
  Administered 2016-12-09: 135 mg via INTRAVENOUS

## 2016-12-09 MED ORDER — OXYCODONE HCL 5 MG PO TABS: 5.0000 mg | ORAL_TABLET | Freq: Once | ORAL | Status: DC | PRN

## 2016-12-09 MED ORDER — MIDAZOLAM HCL 2 MG/2ML IJ SOLN
INTRAMUSCULAR | Status: DC | PRN
  Administered 2016-12-09: 2 mg via INTRAVENOUS

## 2016-12-09 MED ORDER — SUGAMMADEX SODIUM 200 MG/2ML IV SOLN
INTRAVENOUS | Status: AC
  Filled 2016-12-09: qty 2

## 2016-12-09 MED ORDER — KETOROLAC TROMETHAMINE 30 MG/ML IJ SOLN
INTRAMUSCULAR | Status: AC
  Filled 2016-12-09: qty 1

## 2016-12-09 MED ORDER — OXYCODONE HCL 5 MG/5ML PO SOLN: 5.0000 mg | Freq: Once | ORAL | Status: DC | PRN

## 2016-12-09 MED ORDER — ROCURONIUM BROMIDE 100 MG/10ML IV SOLN
INTRAVENOUS | Status: DC | PRN
  Administered 2016-12-09: 30 mg via INTRAVENOUS

## 2016-12-09 MED ORDER — FAMOTIDINE 20 MG PO TABS
20.0000 mg | ORAL_TABLET | Freq: Once | ORAL | Status: AC
  Administered 2016-12-09: 20 mg via ORAL

## 2016-12-09 MED ORDER — KETOROLAC TROMETHAMINE 30 MG/ML IJ SOLN
INTRAMUSCULAR | Status: DC | PRN
  Administered 2016-12-09: 30 mg via INTRAVENOUS

## 2016-12-09 MED ORDER — DEXAMETHASONE SODIUM PHOSPHATE 10 MG/ML IJ SOLN
INTRAMUSCULAR | Status: DC | PRN
  Administered 2016-12-09: 4 mg via INTRAVENOUS

## 2016-12-09 MED ORDER — MIDAZOLAM HCL 2 MG/2ML IJ SOLN
INTRAMUSCULAR | Status: AC
  Filled 2016-12-09: qty 2

## 2016-12-09 MED ORDER — ACETAMINOPHEN 650 MG RE SUPP
650.0000 mg | RECTAL | Status: DC | PRN
  Filled 2016-12-09: qty 1

## 2016-12-09 MED ORDER — MORPHINE SULFATE (PF) 4 MG/ML IV SOLN: 1.0000 mg | INTRAVENOUS | Status: DC | PRN

## 2016-12-09 SURGICAL SUPPLY — 45 items
BLADE SURG SZ11 CARB STEEL (BLADE) ×4 IMPLANT
CANISTER SUCT 1200ML W/VALVE (MISCELLANEOUS) ×4 IMPLANT
CANISTER SUCT 3000ML PPV (MISCELLANEOUS) ×4 IMPLANT
CATH ROBINSON RED A/P 16FR (CATHETERS) ×4 IMPLANT
CHLORAPREP W/TINT 26ML (MISCELLANEOUS) ×4 IMPLANT
DERMABOND ADVANCED (GAUZE/BANDAGES/DRESSINGS) ×2
DERMABOND ADVANCED .7 DNX12 (GAUZE/BANDAGES/DRESSINGS) ×2 IMPLANT
DRSG TEGADERM 2-3/8X2-3/4 SM (GAUZE/BANDAGES/DRESSINGS) ×4 IMPLANT
DRSG TELFA 4X3 1S NADH ST (GAUZE/BANDAGES/DRESSINGS) IMPLANT
ENDOPOUCH RETRIEVER 10 (MISCELLANEOUS) IMPLANT
GAUZE SPONGE NON-WVN 2X2 STRL (MISCELLANEOUS) IMPLANT
GLOVE BIO SURGEON STRL SZ8 (GLOVE) ×4 IMPLANT
GLOVE INDICATOR 8.0 STRL GRN (GLOVE) ×4 IMPLANT
GOWN STRL REUS W/ TWL LRG LVL3 (GOWN DISPOSABLE) ×2 IMPLANT
GOWN STRL REUS W/ TWL XL LVL3 (GOWN DISPOSABLE) ×2 IMPLANT
GOWN STRL REUS W/TWL LRG LVL3 (GOWN DISPOSABLE) ×2
GOWN STRL REUS W/TWL XL LVL3 (GOWN DISPOSABLE) ×2
GRASPER SUT TROCAR 14GX15 (MISCELLANEOUS) ×4 IMPLANT
IRRIGATION STRYKERFLOW (MISCELLANEOUS) IMPLANT
IRRIGATOR STRYKERFLOW (MISCELLANEOUS)
IV LACTATED RINGERS 1000ML (IV SOLUTION) ×4 IMPLANT
KIT PINK PAD W/HEAD ARE REST (MISCELLANEOUS) ×4
KIT PINK PAD W/HEAD ARM REST (MISCELLANEOUS) ×2 IMPLANT
LABEL OR SOLS (LABEL) ×4 IMPLANT
NEEDLE VERESS 14GA 120MM (NEEDLE) ×4 IMPLANT
NOVASURE ENDOMETRIAL ABLATION (MISCELLANEOUS) ×4 IMPLANT
NS IRRIG 500ML POUR BTL (IV SOLUTION) ×4 IMPLANT
PACK DNC HYST (MISCELLANEOUS) ×4 IMPLANT
PACK GYN LAPAROSCOPIC (MISCELLANEOUS) ×4 IMPLANT
PAD OB MATERNITY 4.3X12.25 (PERSONAL CARE ITEMS) ×4 IMPLANT
PAD PREP 24X41 OB/GYN DISP (PERSONAL CARE ITEMS) ×4 IMPLANT
SCISSORS METZENBAUM CVD 33 (INSTRUMENTS) IMPLANT
SHEARS HARMONIC ACE PLUS 36CM (ENDOMECHANICALS) IMPLANT
SLEEVE ENDOPATH XCEL 5M (ENDOMECHANICALS) IMPLANT
SPONGE VERSALON 2X2 STRL (MISCELLANEOUS)
STRAP SAFETY BODY (MISCELLANEOUS) ×4 IMPLANT
SUT VIC AB 0 CT1 36 (SUTURE) ×4 IMPLANT
SUT VIC AB 2-0 UR6 27 (SUTURE) IMPLANT
SUT VIC AB 4-0 PS2 18 (SUTURE) IMPLANT
SYRINGE 10CC LL (SYRINGE) ×4 IMPLANT
TROCAR ENDO BLADELESS 11MM (ENDOMECHANICALS) IMPLANT
TROCAR XCEL NON-BLD 5MMX100MML (ENDOMECHANICALS) ×4 IMPLANT
TUBING CONNECTING 10 (TUBING) ×3 IMPLANT
TUBING CONNECTING 10' (TUBING) ×1
TUBING INSUFFLATOR HI FLOW (MISCELLANEOUS) ×4 IMPLANT

## 2016-12-09 NOTE — OR Nursing (Signed)
Patient denies any need for pain meds desires discharge.

## 2016-12-09 NOTE — Anesthesia Preprocedure Evaluation (Signed)
Anesthesia Evaluation  Patient identified by MRN, date of birth, ID band Patient awake    Reviewed: Allergy & Precautions, H&P , NPO status , Patient's Chart, lab work & pertinent test results  Airway Mallampati: III  TM Distance: >3 FB Neck ROM: full    Dental  (+) Chipped   Pulmonary neg shortness of breath, Current Smoker,    Pulmonary exam normal breath sounds clear to auscultation       Cardiovascular Exercise Tolerance: Good (-) angina(-) Past MI and (-) DOE negative cardio ROS Normal cardiovascular exam Rhythm:regular Rate:Normal     Neuro/Psych  Neuromuscular disease negative psych ROS   GI/Hepatic negative GI ROS, Neg liver ROS,   Endo/Other  negative endocrine ROS  Renal/GU      Musculoskeletal   Abdominal   Peds  Hematology negative hematology ROS (+)   Anesthesia Other Findings Past Medical History: No date: Carpal tunnel syndrome     Comment: RIGHT  No date: Dysfunctional uterine bleeding 11/27/2014: 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: Hx of mammogram     Comment: Baseline Birad 1  Past Surgical History: No date: CESAREAN SECTION 11/17/2014: OTHER SURGICAL HISTORY     Comment: vaginal biopsy (fibroepitheal polyp) No date: WISDOM TOOTH EXTRACTION  BMI    Body Mass Index:  23.89 kg/m      Reproductive/Obstetrics negative OB ROS                             Anesthesia Physical Anesthesia Plan  ASA: III  Anesthesia Plan: General ETT   Post-op Pain Management:    Induction: Intravenous  PONV Risk Score and Plan: 4 or greater and Ondansetron, Dexamethasone, Propofol, Midazolam and Treatment may vary due to age  Airway Management Planned: Oral ETT  Additional Equipment:   Intra-op Plan:   Post-operative Plan: Extubation in OR  Informed Consent: I have reviewed the patients History and Physical, chart, labs and discussed the procedure including the risks,  benefits and alternatives for the proposed anesthesia with the patient or authorized representative who has indicated his/her understanding and acceptance.   Dental Advisory Given  Plan Discussed with: Anesthesiologist, CRNA and Surgeon  Anesthesia Plan Comments: (Patient consented for risks of anesthesia including but not limited to:  - adverse reactions to medications - damage to teeth, lips or other oral mucosa - sore throat or hoarseness - Damage to heart, brain, lungs or loss of life  Patient voiced understanding.)        Anesthesia Quick Evaluation

## 2016-12-09 NOTE — Anesthesia Postprocedure Evaluation (Signed)
Anesthesia Post Note  Patient: Deanna Torres  Procedure(s) Performed: Procedure(s) (LRB): LAPAROSCOPIC BILATERAL SALPINGECTOMY (Bilateral) DILATATION & CURETTAGE/HYSTEROSCOPY WITH NOVASURE ABLATION (N/A)  Patient location during evaluation: PACU Anesthesia Type: General Level of consciousness: awake and alert Pain management: pain level controlled Vital Signs Assessment: post-procedure vital signs reviewed and stable Respiratory status: spontaneous breathing, nonlabored ventilation, respiratory function stable and patient connected to nasal cannula oxygen Cardiovascular status: blood pressure returned to baseline and stable Postop Assessment: no signs of nausea or vomiting Anesthetic complications: no     Last Vitals:  Vitals:   12/09/16 1243 12/09/16 1258  BP: 112/69 114/79  Pulse: 83 65  Resp: 11 12  Temp:  36.9 C    Last Pain:  Vitals:   12/09/16 1258  TempSrc:   PainSc: 2                  Cleda MccreedyJoseph K Piscitello

## 2016-12-09 NOTE — Transfer of Care (Signed)
Immediate Anesthesia Transfer of Care Note  Patient: Deanna SitesMargaret A Grissett  Procedure(s) Performed: Procedure(s): LAPAROSCOPIC BILATERAL SALPINGECTOMY (Bilateral) DILATATION & CURETTAGE/HYSTEROSCOPY WITH NOVASURE ABLATION (N/A)  Patient Location: PACU  Anesthesia Type:General  Level of Consciousness: sedated  Airway & Oxygen Therapy: Patient Spontanous Breathing and Patient connected to face mask oxygen  Post-op Assessment: Report given to RN and Post -op Vital signs reviewed and stable  Post vital signs: Reviewed and stable  Last Vitals:  Vitals:   12/09/16 0906 12/09/16 1158  BP: 107/75 113/66  Pulse: 70 62  Resp:  (!) 8  Temp: 36.7 C     Last Pain:  Vitals:   12/09/16 0906  TempSrc: Oral         Complications: No apparent anesthesia complications

## 2016-12-09 NOTE — Anesthesia Post-op Follow-up Note (Cosign Needed)
Anesthesia QCDR form completed.        

## 2016-12-09 NOTE — Op Note (Signed)
Operative Note   12/09/2016  PRE-OP DIAGNOSIS: Menorrhagia, Sterilization   POST-OP DIAGNOSIS: same   SURGEON: Annamarie MajorPaul Carlton Buskey, MD, FACOG   PROCEDURE: Procedure(s): LAPAROSCOPIC BILATERAL SALPINGECTOMY DILATATION & CURETTAGE/HYSTEROSCOPY WITH NOVASURE ABLATION  ANESTHESIA: General   ESTIMATED BLOOD LOSS: min   SPECIMENS: tubes  FLUID DEFICIT: min   COMPLICATIONS: none   DISPOSITION: PACU - hemodynamically stable.   CONDITION: stable   FINDINGS: Exam under anesthesia revealed small, mobile small uterus with no masses and bilateral adnexa without masses or fullness. Hysteroscopy revealed no lesions or polyps, otherwise grossly normal appearing uterine cavity with bilateral tubal ostia and normal appearing endocervical canal. Laparoscopy revealed normal tubes, ovaries, uterus.  PROCEDURE IN DETAIL: After informed consent was obtained, the patient was taken to the operating room where anesthesia was obtained without difficulty. The patient was positioned in the dorsal lithotomy position in [place] stirrups. The patient's bladder was catheterized with an in and out foley catheter. The patient was examined under anesthesia, with the above noted findings. The weightedspeculum was placed inside the patient's vagina, and the the anterior lip of the cervix was seen and grasped with the tenaculum.  The uterine cavity was sounded to 7cm and a Hulka Tenaculum was placed.  Attention is then turned to the abdomen where a veress needle is placed through an infraumbilical incision after Marcaine is used to anesthesize the skin.  Veress needle placement is confirmed using the hanging drop technique and the abdomen is then insufflated w CO2 gas.  A 5mm trocar is then placed under direct visualization with the laparoscope and the above mentioned findings seen.  An additional 5mm  trocar is placed RLQ.  The left fallopian tube is grasped and dissected free using the Harmonic scapel along the mesosalpinx with  preservation of the main ovarian arterial blood flow to the ovary.  The same is performed on the right.   Hemostasis is assured.  Gas is expelled and trocars removed with skin closure using Dermabond.  Return to vaginal approach.  The cervix was progressively dilated to a 18 French-Pratt dilator. The 30 degree hysteroscope was introduced, with LR fluid used to distend the intrauterine cavity, with the above noted findings. Hysteroscope removed.    The NovaSure device was then placed without difficulty. Measurements were obtained. Patient was noted to have a uterine length of 7cm, a cervical length of 2 cm, and a cervical width of 2.5 cm. The NovaSure device is first tested and after confirmation the procedures performed. Length of procedure was 71 seconds. Power 69. The NovaSure device is then removed and repeat hysteroscopy reveals an appropriate lining of the uterus and no perforation or injury. Hysteroscope is removed with minimal discrepancy of fluid.    Tenaculum was removed with excellent hemostasis noted. She was then taken out of dorsal lithotomy. Minimal discrepancy in fluid was noted. No bleeding.  The patient tolerated the procedure well. Sponge, lap and needle counts were correct x2. The patient was taken to recovery room in excellent condition.

## 2016-12-09 NOTE — H&P (Signed)
History and Physical Interval Note:  12/09/2016 10:24 AM  Deanna SitesMargaret A Berryman  has presented today for surgery, with the diagnosis of MENOMETRORRHAGIA,DESIRE FOR STERILITY  The various methods of treatment have been discussed with the patient and family. After consideration of risks, benefits and other options for treatment, the patient has consented to  Procedure(s): LAPAROSCOPIC BILATERAL SALPINGECTOMY (Bilateral) DILATATION & CURETTAGE/HYSTEROSCOPY WITH NOVASURE ABLATION (N/A) as a surgical intervention .  The patient's history has been reviewed, patient examined, no change in status, stable for surgery.  Pt has the following beta blocker history-  Not taking Beta Blocker.  I have reviewed the patient's chart and labs.  Questions were answered to the patient's satisfaction.    Letitia Libraobert Paul Seif Teichert

## 2016-12-09 NOTE — Anesthesia Procedure Notes (Signed)
Procedure Name: Intubation Performed by: Lance Muss Pre-anesthesia Checklist: Patient identified, Patient being monitored, Timeout performed, Emergency Drugs available and Suction available Patient Re-evaluated:Patient Re-evaluated prior to inductionOxygen Delivery Method: Circle system utilized Preoxygenation: Pre-oxygenation with 100% oxygen Intubation Type: IV induction Ventilation: Mask ventilation without difficulty Laryngoscope Size: Mac and 3 Grade View: Grade II Tube type: Oral Tube size: 7.0 mm Number of attempts: 1 Airway Equipment and Method: Stylet Placement Confirmation: ETT inserted through vocal cords under direct vision,  positive ETCO2 and breath sounds checked- equal and bilateral Secured at: 21 cm Tube secured with: Tape Dental Injury: Teeth and Oropharynx as per pre-operative assessment

## 2016-12-09 NOTE — Discharge Instructions (Signed)
AMBULATORY SURGERY  DISCHARGE INSTRUCTIONS   1) The drugs that you were given will stay in your system until tomorrow so for the next 24 hours you should not:  A) Drive an automobile B) Make any legal decisions C) Drink any alcoholic beverage   2) You may resume regular meals tomorrow.  Today it is better to start with liquids and gradually work up to solid foods.  You may eat anything you prefer, but it is better to start with liquids, then soup and crackers, and gradually work up to solid foods.   3) Please notify your doctor immediately if you have any unusual bleeding, trouble breathing, redness and pain at the surgery site, drainage, fever, or pain not relieved by medication.    4) Additional Instructions:        Please contact your physician with any problems or Same Day Surgery at [phone], Monday through Friday 6 am to 4 pm, or [place] at [facility] Main number at [phone].Diagnostic Laparoscopy, Care After Refer to this sheet in the next few weeks. These instructions provide you with information about caring for yourself after your procedure. Your health care provider may also give you more specific instructions. Your treatment has been planned according to current medical practices, but problems sometimes occur. Call your health care provider if you have any problems or questions after your procedure. What can I expect after the procedure? After your procedure, it is common to have mild discomfort in the throat and abdomen. Follow these instructions at home:  Take over-the-counter and prescription medicines only as told by your health care provider.  Do not drive for 24 hours if you received a sedative.  Return to your normal activities as told by your health care provider.  Do not take baths, swim, or use a hot tub until your health care provider approves. You may shower.  Follow instructions from your health care provider about how to take care of your  incision. Make sure you: ? Wash your hands with soap and water before you change your bandage (dressing). If soap and water are not available, use hand sanitizer. ? Change your dressing WEDNESDAY, leave uncovered. ? Leave stitches (sutures), skin glue, or adhesive strips in place. These skin closures may need to stay in place for 2 weeks or longer. If adhesive strip edges start to loosen and curl up, you may trim the loose edges. Do not remove adhesive strips completely unless your health care provider tells you to do that.  Check your incision area every day for signs of infection. Check for: ? More redness, swelling, or pain. ? More fluid or blood. ? Warmth. ? Pus or a bad smell.  It is your responsibility to get the results of your procedure. Ask your health care provider or the department performing the procedure when your results will be ready. Contact a health care provider if:  There is new pain in your shoulders.  You feel light-headed or faint.  You are unable to pass gas or unable to have a bowel movement.  You feel nauseous or you vomit.  You develop a rash.  You have more redness, swelling, or pain around your incision.  You have more fluid or blood coming from your incision.  Your incision feels warm to the touch.  You have pus or a bad smell coming from your incision.  You have a fever or chills. Get help right away if:  Your pain is getting worse.  You have ongoing  vomiting.  The edges of your incision open up.  You have trouble breathing.  You have chest pain.   Hysteroscopy, Care After Refer to this sheet in the next few weeks. These instructions provide you with information on caring for yourself after your procedure. Your health care provider may also give you more specific instructions. Your treatment has been planned according to current medical practices, but problems sometimes occur. Call your health care provider if you have any problems or  questions after your procedure. What can I expect after the procedure? After your procedure, it is typical to have the following:  You may have some cramping. This normally lasts for a couple days.  You may have bleeding. This can vary from light spotting for a few days to menstrual-like bleeding for 3-7 days.  Follow these instructions at home:  Rest for the first 1-2 days after the procedure.  Only take over-the-counter or prescription medicines as directed by your health care provider. Do not take aspirin. It can increase the chances of bleeding.  Take showers instead of baths for 2 weeks or as directed by your health care provider.  Do not drive for 24 hours or as directed.  Do not drink alcohol while taking pain medicine.  Do not use tampons, douche, or have sexual intercourse for 2 weeks or until your health care provider says it is okay.  Take your temperature twice a day for 4-5 days. Write it down each time.  Follow your health care provider's advice about diet, exercise, and lifting.  If you develop constipation, you may: ? Take a mild laxative if your health care provider approves. ? Add bran foods to your diet. ? Drink enough fluids to keep your urine clear or pale yellow.  Try to have someone with you or available to you for the first 24-48 hours, especially if you were given a general anesthetic.  Follow up with your health care provider as directed. Contact a health care provider if:  You feel dizzy or lightheaded.  You feel sick to your stomach (nauseous).  You have abnormal vaginal discharge.  You have a rash.  You have pain that is not controlled with medicine. Get help right away if:  You have bleeding that is heavier than a normal menstrual period.  You have a fever.  You have increasing cramps or pain, not controlled with medicine.  You have new belly (abdominal) pain.  You pass out.  You have pain in the tops of your shoulders (shoulder  strap areas).  You have shortness of breath. This information is not intended to replace advice given to you by your health care provider. Make sure you discuss any questions you have with your health care provider. Document Released: 04/13/2013 Document Revised: 11/29/2015 Document Reviewed: 01/20/2013 Elsevier Interactive Patient Education  2017 [employer].

## 2016-12-10 ENCOUNTER — Encounter: Payer: Self-pay | Admitting: Obstetrics & Gynecology

## 2016-12-10 LAB — SURGICAL PATHOLOGY

## 2016-12-13 ENCOUNTER — Encounter: Payer: Self-pay | Admitting: Obstetrics & Gynecology

## 2016-12-24 ENCOUNTER — Encounter: Payer: Self-pay | Admitting: Obstetrics & Gynecology

## 2016-12-24 ENCOUNTER — Ambulatory Visit (INDEPENDENT_AMBULATORY_CARE_PROVIDER_SITE_OTHER): Payer: BLUE CROSS/BLUE SHIELD | Admitting: Obstetrics & Gynecology

## 2016-12-24 VITALS — BP 100/60 | HR 79 | Ht 66.0 in | Wt 148.0 lb

## 2016-12-24 DIAGNOSIS — Z302 Encounter for sterilization: Secondary | ICD-10-CM

## 2016-12-24 DIAGNOSIS — N920 Excessive and frequent menstruation with regular cycle: Secondary | ICD-10-CM

## 2016-12-24 NOTE — Progress Notes (Signed)
  Postoperative Follow-up Patient presents post op from ABlation and Tubal for abnormal uterine bleeding and requested sterilization, 2 weeks ago.  Subjective: Patient reports marked improvement in her preop symptoms. Eating a regular diet without difficulty. The patient is not having any pain.  Activity: normal activities of daily living. Patient reports vaginal sx's of None  Objective: BP 100/60   Pulse 79   Ht 5\' 6"  (1.676 m)   Wt 148 lb (67.1 kg)   BMI 23.89 kg/m  Physical Exam  Constitutional: She is oriented to person, place, and time. She appears well-developed and well-nourished. No distress.  Cardiovascular: Normal rate.   Pulmonary/Chest: Effort normal.  Abdominal: Soft. She exhibits no distension. There is no tenderness.  Incision Healing Well   Musculoskeletal: Normal range of motion.  Neurological: She is alert and oriented to person, place, and time. No cranial nerve deficit.  Skin: Skin is warm and dry.  Psychiatric: She has a normal mood and affect.    Assessment: s/p :  tubal ligation and endometrial ablation stable  Plan: Patient has done well after surgery with no apparent complications.  I have discussed the post-operative course to date, and the expected progress moving forward.  The patient understands what complications to be concerned about.  I will see the patient in routine follow up, or sooner if needed.    Activity plan: No restriction.  Monitor future cycles   Deanna LibraRobert Paul Kaevion Torres 12/24/2016, 10:48 AM

## 2017-01-31 ENCOUNTER — Encounter: Payer: Self-pay | Admitting: Emergency Medicine

## 2017-01-31 ENCOUNTER — Emergency Department
Admission: EM | Admit: 2017-01-31 | Discharge: 2017-01-31 | Disposition: A | Discharge status: Home / Self Care | Payer: BLUE CROSS/BLUE SHIELD | Attending: Emergency Medicine | Admitting: Emergency Medicine

## 2017-01-31 DIAGNOSIS — Z79899 Other long term (current) drug therapy: Secondary | ICD-10-CM | POA: Insufficient documentation

## 2017-01-31 DIAGNOSIS — L509 Urticaria, unspecified: Secondary | ICD-10-CM | POA: Diagnosis not present

## 2017-01-31 DIAGNOSIS — L519 Erythema multiforme, unspecified: Secondary | ICD-10-CM

## 2017-01-31 DIAGNOSIS — F17210 Nicotine dependence, cigarettes, uncomplicated: Secondary | ICD-10-CM | POA: Insufficient documentation

## 2017-01-31 DIAGNOSIS — R21 Rash and other nonspecific skin eruption: Secondary | ICD-10-CM | POA: Diagnosis present

## 2017-01-31 LAB — CBC
HEMATOCRIT: 44.7 % (ref 35.0–47.0)
HEMOGLOBIN: 14.8 g/dL (ref 12.0–16.0)
MCH: 27 pg (ref 26.0–34.0)
MCHC: 33.1 g/dL (ref 32.0–36.0)
MCV: 81.6 fL (ref 80.0–100.0)
Platelets: 211 10*3/uL (ref 150–440)
RBC: 5.48 MIL/uL — ABNORMAL HIGH (ref 3.80–5.20)
RDW: 13.5 % (ref 11.5–14.5)
WBC: 17.5 10*3/uL — ABNORMAL HIGH (ref 3.6–11.0)

## 2017-01-31 LAB — COMPREHENSIVE METABOLIC PANEL
ALBUMIN: 4.5 g/dL (ref 3.5–5.0)
ALK PHOS: 41 U/L (ref 38–126)
ALT: 27 U/L (ref 14–54)
ANION GAP: 7 (ref 5–15)
AST: 27 U/L (ref 15–41)
BILIRUBIN TOTAL: 0.5 mg/dL (ref 0.3–1.2)
BUN: 10 mg/dL (ref 6–20)
CALCIUM: 9.1 mg/dL (ref 8.9–10.3)
CO2: 25 mmol/L (ref 22–32)
Chloride: 108 mmol/L (ref 101–111)
Creatinine, Ser: 0.75 mg/dL (ref 0.44–1.00)
GFR calc Af Amer: >60 mL/min (ref >60)
Glucose, Bld: 114 mg/dL — ABNORMAL HIGH (ref 65–99)
Potassium: 3.4 mmol/L — ABNORMAL LOW (ref 3.5–5.1)
Sodium: 140 mmol/L (ref 135–145)
TOTAL PROTEIN: 7.5 g/dL (ref 6.5–8.1)

## 2017-01-31 MED ORDER — DIPHENHYDRAMINE HCL 50 MG/ML IJ SOLN
50.0000 mg | Freq: Once | INTRAMUSCULAR | Status: AC
  Administered 2017-01-31: 50 mg via INTRAVENOUS
  Filled 2017-01-31: qty 1

## 2017-01-31 MED ORDER — FAMOTIDINE IN NACL 20-0.9 MG/50ML-% IV SOLN
20.0000 mg | Freq: Once | INTRAVENOUS | Status: AC
  Administered 2017-01-31: 20 mg via INTRAVENOUS
  Filled 2017-01-31: qty 50

## 2017-01-31 MED ORDER — METHYLPREDNISOLONE SODIUM SUCC 125 MG IJ SOLR
125.0000 mg | Freq: Once | INTRAMUSCULAR | Status: AC
  Administered 2017-01-31: 125 mg via INTRAVENOUS
  Filled 2017-01-31: qty 2

## 2017-01-31 MED ORDER — PREDNISONE 20 MG PO TABS: 60.0000 mg | ORAL_TABLET | Freq: Every day | ORAL | 0 refills | Status: AC

## 2017-01-31 NOTE — ED Triage Notes (Signed)
Pt ambulatory to triage in NAD, report rash to torso and on upper arms and legs, went to PCP today, had steroid injection and taking benadryl w/o relief.  Unsure of cause of rash.

## 2017-01-31 NOTE — ED Provider Notes (Signed)
[facility] Emergency Department Provider Note   First MD Initiated Contact with Patient 01/31/17 [phone]     (approximate)  I have reviewed the triage vital signs and the nursing notes.   HISTORY  Chief Complaint Rash   HPI Deanna Torres is a 41 y.o. female with below list of chronic medical conditions presents to the emergency department with generalized pruritic rash 3 days. Patient says that she was seen by her primary care provider and given a steroid injection and was advised to take 1 Benadryl if itching reoccurred. Patient presents to the emergency department now stating that rash has worsened and that despite taking one Benadryl symptoms have not improved. Patient denies any difficulty breathing or difficulty swallowing.   Past Medical History:  Diagnosis Date  . Carpal tunnel syndrome    RIGHT   . Dysfunctional uterine bleeding   . 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 Hx of mammogram 10/28/2012   Baseline Birad 1    Patient Active Problem List   Diagnosis Date Noted  . Admission for sterilization 12/09/2016  . Menorrhagia 12/09/2016    Past Surgical History:  Procedure Laterality Date  . CESAREAN SECTION    . DILITATION & CURRETTAGE/HYSTROSCOPY WITH NOVASURE ABLATION N/A 12/09/2016   Procedure: DILATATION & CURETTAGE/HYSTEROSCOPY WITH NOVASURE ABLATION;  Surgeon: Gae Dry, MD;  Location: [facility] ORS;  Service: Gynecology;  Laterality: N/A;  . LAPAROSCOPIC BILATERAL SALPINGECTOMY Bilateral 12/09/2016   Procedure: LAPAROSCOPIC BILATERAL SALPINGECTOMY;  Surgeon: Gae Dry, MD;  Location: [facility] ORS;  Service: Gynecology;  Laterality: Bilateral;  . OTHER SURGICAL HISTORY  11/17/2014   vaginal biopsy (fibroepitheal polyp)  . WISDOM TOOTH EXTRACTION      Prior to Admission medications   Medication Sig Start Date End Date Taking? Authorizing Provider  cetirizine (ZYRTEC) 10 MG tablet Take 10 mg by mouth every morning.     [provider]  Cyanocobalamin (B-12) 5000 MCG CAPS Take 5,000 mcg by mouth daily.    [provider]  fluticasone (FLONASE) 50 MCG/ACT nasal spray Place 1 spray into both nostrils daily.    [provider]  ibuprofen (ADVIL,MOTRIN) 200 MG tablet Take 200 mg by mouth every 8 (eight) hours as needed for mild pain.    [provider]  oxyCODONE-acetaminophen (PERCOCET/ROXICET) 5-325 MG tablet Take 1-2 tablets by mouth every 4 (four) hours as needed. Patient not taking: Reported on 12/24/2016 12/09/16   Gae Dry, MD  UNABLE TO FIND Take 1 Package by mouth daily. [place] Multi Dose Pack Multi-Vitamin D-3 : 1000 iu CoQ10 : 100 mg Cranberry : 400 mg Green Tea : 500 mg Vitamin C : 30 mg Fish Oil : 475 mg    [provider]    Allergies Azelastine; Ciprofloxacin-ciproflox hcl er; Isosorbide dinitrate er; Sulfa antibiotics; and Ciprofloxacin  Family History  Problem Relation Age of Onset  . Throat cancer Father   . CVA Father   . Lung cancer Father     Social History Social History  Substance Use Topics  . Smoking status: Current Every Day Smoker    Packs/day: 0.50    Years: 17.00    Types: Cigarettes  . Smokeless tobacco: Never Used  . Alcohol use Yes     Comment: occ    Review of Systems Constitutional: No fever/chills Eyes: No visual changes. ENT: No sore throat. Cardiovascular: Denies chest pain. Respiratory: Denies shortness of breath. Gastrointestinal: No abdominal pain.  No nausea, no vomiting.  No diarrhea.  No constipation. Genitourinary: Negative for dysuria. Musculoskeletal: Negative for neck pain.  Negative for back pain. Integumentary: Positive for rash. Neurological: Negative for headaches, focal weakness or numbness.   ____________________________________________   PHYSICAL EXAM:  VITAL SIGNS: ED Triage Vitals  Enc Vitals Group     BP 01/31/17 0050 139/74     Pulse Rate 01/31/17 0050 87     Resp 01/31/17  0050 16     Temp 01/31/17 0050 98.3 F (36.8 C)     Temp Source 01/31/17 0050 Oral     SpO2 01/31/17 0050 100 %     Weight 01/31/17 0048 67.6 kg (149 lb)     Height 01/31/17 0048 1.676 m (5\' 6" )     Head Circumference --      Peak Flow --      Pain Score 01/31/17 0047 0     Pain Loc --      Pain Edu? --      Excl. in [place]? --     Constitutional: Alert and oriented. Well appearing and in no acute distress. Eyes: Conjunctivae are normal. Head: Atraumatic. Mouth/Throat: Mucous membranes are moist. Neck: No stridor.   Cardiovascular: Normal rate, regular rhythm. Good peripheral circulation. Grossly normal heart sounds. Respiratory: Normal respiratory effort.  No retractions. Lungs CTAB. Gastrointestinal: Soft and nontender. No distention.   Musculoskeletal: No lower extremity tenderness nor edema. No gross deformities of extremities. Neurologic:  Normal speech and language. No gross focal neurologic deficits are appreciated.  Skin:  Generalized serpingious rash chest abdomen bilateral lower extremity with overlying excoriations. Psychiatric: Mood and affect are normal. Speech and behavior are normal.  ____________________________________________   LABS (all labs ordered are listed, but only abnormal results are displayed)  Labs Reviewed  CBC - Abnormal; Notable for the following:       Result Value   WBC 17.5 (*)    RBC 5.48 (*)    All other components within normal limits  COMPREHENSIVE METABOLIC PANEL - Abnormal; Notable for the following:    Potassium 3.4 (*)    Glucose, Bld 114 (*)    All other components within normal limits     Procedures   ____________________________________________   INITIAL IMPRESSION / ASSESSMENT AND PLAN / ED COURSE  Pertinent labs & imaging results that were available during my care of the patient were reviewed by me and considered in my medical decision making (see chart for details).  Patient given 50 mg of Benadryl) 25 mg Solu-Medrol  and Pepcid 20 mg IV with complete resolution of pruritus and rash.     ____________________________________________  FINAL CLINICAL IMPRESSION(S) / ED DIAGNOSES  Final diagnoses:  Erythema multiforme  Hives     MEDICATIONS GIVEN DURING THIS VISIT:  Medications  methylPREDNISolone sodium succinate (SOLU-MEDROL) 125 mg/2 mL injection 125 mg (125 mg Intravenous Given 01/31/17 0417)  diphenhydrAMINE (BENADRYL) injection 50 mg (50 mg Intravenous Given 01/31/17 0417)  famotidine (PEPCID) IVPB 20 mg premix (20 mg Intravenous New Bag/Given 01/31/17 0417)     NEW OUTPATIENT MEDICATIONS STARTED DURING THIS VISIT:  New Prescriptions   No medications on file    Modified Medications   No medications on file    Discontinued Medications   No medications on file     Note:  This document was prepared using Dragon voice recognition software and may include unintentional dictation errors.    Gregor Hams, MD 01/31/17 [phone]

## 2017-01-31 NOTE — ED Notes (Signed)
Patient verbalizes understanding of d/c instructions and follow-up. VS stable and pain controlled per patient.  Patient in NAD at time of d/c and denies further concerns regarding this visit. Patient stable at the time of departure from the unit, departing unit by the safest and most appropriate manner per that patients condition and limitations. Patient advised to return to the ED at any time for emergent concerns, or for new/worsening symptoms.   

## 2017-02-01 ENCOUNTER — Ambulatory Visit
Admission: EM | Admit: 2017-02-01 | Discharge: 2017-02-01 | Disposition: A | Discharge status: Home / Self Care | Payer: BLUE CROSS/BLUE SHIELD | Attending: Family Medicine | Admitting: Family Medicine

## 2017-02-01 DIAGNOSIS — L509 Urticaria, unspecified: Secondary | ICD-10-CM | POA: Diagnosis not present

## 2017-02-01 DIAGNOSIS — R21 Rash and other nonspecific skin eruption: Secondary | ICD-10-CM

## 2017-02-01 HISTORY — DX: Other allergic rhinitis: J30.89

## 2017-02-01 NOTE — ED Provider Notes (Signed)
[facility]    CSN: [id number] Arrival date & time: 02/01/17  0802     History   Chief Complaint Chief Complaint  Patient presents with  . Allergies   HPI  41 year old female presents with complaints of hives.  Patient states that it started on Thursday. Located diffusely. She saw her PCP on Friday. Was given a shot of steroid and instructed to use Benadryl. She had worsening symptoms and went to the ED yesterday. She was given Benadryl, Solu-Medrol, Pepcid with resolution of her rash. She was discharged on a taper of prednisone. Since today with complaints of a recent flare. She states she continues to have hives intermittently. She has no hives currently. She states that her neck feels swollen but she has no shortness of breath or difficulty swallowing. She endorses compliance with Benadryl, prednisone, Zyrtec. She's no longer using an H2 blocker.   Past Medical History:  Diagnosis Date  . Carpal tunnel syndrome    RIGHT   . Dysfunctional uterine bleeding   . Environmental and seasonal allergies   . 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 Hx of mammogram 10/28/2012   Baseline Birad 1   Patient Active Problem List   Diagnosis Date Noted  . Admission for sterilization 12/09/2016  . Menorrhagia 12/09/2016   Past Surgical History:  Procedure Laterality Date  . CESAREAN SECTION    . DILITATION & CURRETTAGE/HYSTROSCOPY WITH NOVASURE ABLATION N/A 12/09/2016   Procedure: DILATATION & CURETTAGE/HYSTEROSCOPY WITH NOVASURE ABLATION;  Surgeon: Nadara MustardHarris, Robert P, MD;  Location: [facility] ORS;  Service: Gynecology;  Laterality: N/A;  . LAPAROSCOPIC BILATERAL SALPINGECTOMY Bilateral 12/09/2016   Procedure: LAPAROSCOPIC BILATERAL SALPINGECTOMY;  Surgeon: Nadara MustardHarris, Robert P, MD;  Location: [facility] ORS;  Service: Gynecology;  Laterality: Bilateral;  . OTHER SURGICAL HISTORY  11/17/2014   vaginal biopsy (fibroepitheal polyp)  . WISDOM TOOTH EXTRACTION     OB History    Gravida Para Term  Preterm AB Living   2 1 1   1 1    SAB TAB Ectopic Multiple Live Births                 Home Medications    Prior to Admission medications   Medication Sig Start Date End Date Taking? Authorizing Provider  diphenhydrAMINE (BENADRYL) 50 MG capsule Take 50 mg by mouth every 6 (six) hours as needed.   Yes [provider]  cetirizine (ZYRTEC) 10 MG tablet Take 10 mg by mouth every morning.     [provider]  Cyanocobalamin (B-12) 5000 MCG CAPS Take 5,000 mcg by mouth daily.    [provider]  fluticasone (FLONASE) 50 MCG/ACT nasal spray Place 1 spray into both nostrils daily.    [provider]  ibuprofen (ADVIL,MOTRIN) 200 MG tablet Take 200 mg by mouth every 8 (eight) hours as needed for mild pain.    [provider]  oxyCODONE-acetaminophen (PERCOCET/ROXICET) 5-325 MG tablet Take 1-2 tablets by mouth every 4 (four) hours as needed. Patient not taking: Reported on 12/24/2016 12/09/16   Nadara MustardHarris, Robert P, MD  predniSONE (DELTASONE) 20 MG tablet Take 3 tablets (60 mg total) by mouth daily. 01/31/17 02/05/17  Darci CurrentBrown, Preston N, MD  UNABLE TO FIND Take 1 Package by mouth daily. [place] Multi Dose Pack Multi-Vitamin D-3 : 1000 iu CoQ10 : 100 mg Cranberry : 400 mg Green Tea : 500 mg Vitamin C : 30 mg Fish Oil : 475 mg    [provider]    Family History Family  History  Problem Relation Age of Onset  . Throat cancer Father   . CVA Father   . Lung cancer Father    Social History Social History  Substance Use Topics  . Smoking status: Current Every Day Smoker    Packs/day: 0.50    Years: 17.00    Types: Cigarettes  . Smokeless tobacco: Never Used  . Alcohol use Yes     Comment: occ   Allergies   Azelastine; Ciprofloxacin-ciproflox hcl er; Sulfa antibiotics; and Ciprofloxacin  Review of Systems Review of Systems  Respiratory: Negative for shortness of breath.   Skin: Positive for rash.   Physical Exam Triage Vital  Signs ED Triage Vitals [02/01/17 0816]  Enc Vitals Group     BP      Pulse      Resp      Temp      Temp src      SpO2      Weight      Height      Head Circumference      Peak Flow      Pain Score 2     Pain Loc      Pain Edu?      Excl. in [place]?    Updated Vital Signs BP 123/74 (BP Location: Left Arm)   Pulse (!) 110   Temp 98.2 F (36.8 C) (Oral)   Resp 16   Ht 5\' 6"  (1.676 m)   Wt 149 lb (67.6 kg)   SpO2 100%   BMI 24.05 kg/m   Physical Exam  Constitutional: She is oriented to person, place, and time. She appears well-developed. No distress.  HENT:  Head: Normocephalic and atraumatic.  Mouth/Throat: Oropharynx is clear and moist.  Eyes: Conjunctivae are normal. No scleral icterus.  Cardiovascular: Regular rhythm.   Tachycardic.  Pulmonary/Chest: Effort normal. She has no wheezes. She has no rales.  Neurological: She is alert and oriented to person, place, and time.  Skin:  No rash noted on exam.  Psychiatric: She has a normal mood and affect.  Vitals reviewed.    UC Treatments / Results  Labs (all labs ordered are listed, but only abnormal results are displayed) Labs Reviewed - No data to display  EKG  EKG Interpretation None       Radiology No results found.  Procedures Procedures (including critical care time)  Medications Ordered in UC Medications - No data to display   Initial Impression / Assessment and Plan / UC Course  I have reviewed the triage vital signs and the nursing notes.  Pertinent labs & imaging results that were available during my care of the patient were reviewed by me and considered in my medical decision making (see chart for details).    41 year old female presents with urticaria. She has been treated aggressively and is still having flares. I advised her to see allergy or ENT. Continue prednisone, Benadryl. She may switch nonsedating antihistamines if she desires. Recommended addition of H2 blocker.  Final Clinical  Impressions(s) / UC Diagnoses   Final diagnoses:  Urticaria    New Prescriptions New Prescriptions   No medications on file     Tommie SamsCook, Asjia Berrios G, DO 02/01/17 [id number]

## 2017-02-01 NOTE — Discharge Instructions (Signed)
Continue prednisone, Benadryl, Zyrtec (or you may switch antihistamines). I also recommend that you take Zantac 150 mg twice daily. See if you can think of a potential trigger.  See allergy or ENT. If you develop SOB or other symptoms, go to the ED.  Best of luck and take care  Dr. Adriana Simasook

## 2017-02-01 NOTE — ED Triage Notes (Signed)
Pt seen at PCP on Friday for hives that started on Thursday. Her PCP thought it was a food allergy and gave her Solumedrol. No better yesterday so went to ER and had Pepcid, Benadryl and Solumedrol. Sent home with Prednisone prescription and has had two doses so far. Hives will go away and then flare up again. States she feels like it is somewhat hard to swallow. Handling secretions without difficulty and no trouble breathing or talking.

## 2017-02-06 ENCOUNTER — Encounter: Payer: Self-pay | Admitting: Obstetrics & Gynecology

## 2017-08-19 ENCOUNTER — Ambulatory Visit
Admission: RE | Admit: 2017-08-19 | Discharge: 2017-08-19 | Disposition: A | Discharge status: Home / Self Care | Payer: BLUE CROSS/BLUE SHIELD | Source: Ambulatory Visit | Attending: Obstetrics & Gynecology | Admitting: Obstetrics & Gynecology

## 2017-08-19 DIAGNOSIS — Z1239 Encounter for other screening for malignant neoplasm of breast: Secondary | ICD-10-CM

## 2017-08-19 DIAGNOSIS — Z1231 Encounter for screening mammogram for malignant neoplasm of breast: Secondary | ICD-10-CM | POA: Insufficient documentation

## 2017-08-20 ENCOUNTER — Inpatient Hospital Stay
Admission: RE | Admit: 2017-08-20 | Discharge: 2017-08-20 | Disposition: A | Discharge status: Home / Self Care | Payer: Self-pay | Source: Ambulatory Visit | Attending: *Deleted | Admitting: *Deleted

## 2017-08-20 ENCOUNTER — Other Ambulatory Visit: Payer: Self-pay | Admitting: *Deleted

## 2017-08-20 DIAGNOSIS — Z9289 Personal history of other medical treatment: Secondary | ICD-10-CM

## 2017-12-02 ENCOUNTER — Ambulatory Visit: Payer: BLUE CROSS/BLUE SHIELD | Admitting: Obstetrics & Gynecology

## 2017-12-05 DIAGNOSIS — Z1371 Encounter for nonprocreative screening for genetic disease carrier status: Secondary | ICD-10-CM

## 2017-12-05 HISTORY — DX: Encounter for nonprocreative screening for genetic disease carrier status: Z13.71

## 2017-12-09 ENCOUNTER — Encounter: Payer: Self-pay | Admitting: Obstetrics & Gynecology

## 2017-12-09 ENCOUNTER — Ambulatory Visit (INDEPENDENT_AMBULATORY_CARE_PROVIDER_SITE_OTHER): Payer: BLUE CROSS/BLUE SHIELD | Admitting: Obstetrics & Gynecology

## 2017-12-09 VITALS — BP 100/60 | Ht 66.0 in | Wt 150.0 lb

## 2017-12-09 DIAGNOSIS — Z131 Encounter for screening for diabetes mellitus: Secondary | ICD-10-CM | POA: Diagnosis not present

## 2017-12-09 DIAGNOSIS — Z124 Encounter for screening for malignant neoplasm of cervix: Secondary | ICD-10-CM

## 2017-12-09 DIAGNOSIS — Z8041 Family history of malignant neoplasm of ovary: Secondary | ICD-10-CM

## 2017-12-09 DIAGNOSIS — Z1321 Encounter for screening for nutritional disorder: Secondary | ICD-10-CM | POA: Diagnosis not present

## 2017-12-09 DIAGNOSIS — R232 Flushing: Secondary | ICD-10-CM | POA: Diagnosis not present

## 2017-12-09 DIAGNOSIS — Z0000 Encounter for general adult medical examination without abnormal findings: Secondary | ICD-10-CM | POA: Diagnosis not present

## 2017-12-09 DIAGNOSIS — Z13220 Encounter for screening for lipoid disorders: Secondary | ICD-10-CM

## 2017-12-09 NOTE — Patient Instructions (Signed)
PAP every three years Mammogram every year    Call [phone] to schedule at [place] continue at [place] (February) Labs today  [place] [facility]  What is cancer? Normal cells in the body grow, divide, and are replaced on a routine basis. Sometimes, cells divide abnormally and begin to grow out of control. These cells may form growths or tumors. Tumors can be benign (not cancer) or malignant (cancer). Benign tumors do not spread to other body tissues. Cancer tumors can invade and destroy nearby healthy tissues, bones, and organs. Cancer cells also can spread to other parts of the body and form new cancerous areas.  What causes cancer? Cancer is caused by many different factors. A few types of cancer are caused by changes in genes that can be passed from parent to child. Changes in genes are called mutations. Certain gene mutations are associated with family cancer syndromes. What are family cancer syndromes?  Family cancer syndromes are genetic conditions that increase the risk of certain types of cancer. They also are called hereditary or inherited cancer syndromes. Common family cancer syndromes include hereditary breast and ovarian cancer (HBOC) syndrome, Lynch syndrome, Li-Fraumeni syndrome, Cowden syndrome, and Peutz-Jeghers syndrome. What is genetic testing for cancer? Genetic testing for cancer looks for mutations in certain genes that are known to be linked to cancer. The results can help determine your risk of developing a disease like cancer or passing on a genetic disorder. What is multigene panel testing? Multigene panel testing is a type of genetic testing that looks for mutations in several genes at once. This is different from single-gene testing, which looks for a mutation in a specific gene. Single-gene testing is often used when there is already a known gene mutation in a family. For example, testing for BRCA mutations only looks for changes in  BRCA1 and BRCA2 genes. Who should have genetic testing? You may consider genetic testing if your personal or family history shows that you have an increased risk of cancer. Your obstetrician-gynecologist (ob-gyn) or other health care professional may ask you these and other questions: . Have you or any family members been diagnosed with cancer?  . If yes, which family members were diagnosed, with what types of cancer, and at what ages?  . Were you or any of your family members born with birth defects?  Marland Kitchen Are you of [place] or [place] Jewish ancestry? Depending on your answers, your ob-gyn or other health care professional may suggest that you talk about genetic testing with a genetic counselor or a physician who is an expert in genetics. You can choose to have genetic testing, or you can choose not to. Before you decide, you should have genetic counseling. What is genetic counseling? In genetic counseling, you will talk with a genetic counselor or physician expert about the following: . Your risk of getting a hereditary type of cancer  . Who in your family could potentially get tested  . How testing is done  . What the test results may mean  . What you may do depending on the results How is genetic testing done? Genetic testing typically is done from a blood sample or saliva sample. When is multigene panel testing recommended? Multigene panel testing may be useful if you . are at risk of a family cancer syndrome that has more than one gene associated with it  . have a personal or family history of cancer and single-gene testing has not found  a mutation, or the result is uncertain What are the benefits of multigene panel testing? Multigene panel testing looks at multiple genes with one test. If a gene mutation is found, multigene panel testing may . give you a better understanding of your cancer risk than single-gene testing  . help your health care team decide what cancer screenings  you might need beyond routine screenings  . help you think about what you can do to prevent cancer What are the risks of multigene panel testing? The risks of multigene panel testing may include the following: . Results can be complicated to interpret.  . Testing may find gene mutations that show a moderate or uncertain risk of cancer.  . It may be hard to know what you should do with your test results. You should talk with a genetic counselor or physician expert before and after genetic testing to learn what the results mean. If I have a gene mutation, should I tell my family? Having a gene mutation means you can pass the mutation to your children. Your siblings also may have the gene mutation. Although you do not have to tell your family members, sharing the information could be life-saving for them. With this information, your family members can decide whether to be tested and get cancer screenings at an early age. How can I prevent cancer if I test positive for a gene mutation? If you test positive for a gene mutation, you can discuss cancer screening and prevention options with your ob-gyn, genetic counselor, or other health care professional. It may be helpful to have earlier or more frequent cancer screening tests, which can find cancer at an early and more curable stage. Risk reduction steps like medication, surgery, and lifestyle changes also may be recommended. I'm concerned about discrimination based on genetic testing results. What should I know? Many people are concerned about possible employment discrimination or denial of insurance coverage based on genetic testing results. The Genetic Information Nondiscrimination Act of 2008 (GINA) makes it illegal for health insurers to require genetic testing results or use results to make decisions about coverage, rates, or preexisting conditions. GINA also makes it illegal for employers to discriminate against employees or applicants because of  genetic information. GINA does not apply to life insurance, long-term care insurance, or disability insurance. What should I know about direct-to-consumer genetic tests? Direct-to-consumer (or at-home) genetic tests are sold over the internet. You do not need a doctor's order to get one. The [employer] discourages use of direct-to-consumer genetic tests because the results may be misleading. For example, one commercial test for BRCA mutations only looks for three mutations, even though there are more than 500 BRCA mutations linked to cancer. The test results could cause unnecessary fear, or a false sense that you are not at risk. You should see a health care professional if you want a genetic test.  Glossary BRCA1 and BRCA2: Genes that keep cells from growing too rapidly. Changes in these genes have been linked to an increased risk of breast cancer and ovarian cancer.  Cowden Syndrome: A genetic condition that increases a person's risk of cancer of the breast, thyroid, uterus, colon, kidney, and skin. Genes: Segments of DNA that contain instructions for the development of a person's physical traits and control of the processes in the body. The gene is the basic unit of heredity and can be passed from parent to child. Genetic Counselor: A health care professional with special training in genetics  who can provide expert advice about genetic disorders and prenatal testing. Hereditary Breast and Ovarian Cancer (HBOC) Syndrome: A genetic condition that increases a person's risk of cancer of the breast, ovary, prostate, pancreas, and skin (melanoma). Li-Fraumeni Syndrome: A genetic condition that increases a person's risk of cancer of the breast, bones, soft tissue, brain, and outer layer of the adrenal glands. Lynch Syndrome: A genetic condition that increases a person's risk of cancer of the colon, rectum, ovary, uterus, pancreas, and bile duct. Multigene Panel  Testing: A type of genetic test that can look for mutations in multiple genes at once. Mutations: Changes in genes that can be passed from parent to child. Obstetrician-Gynecologist (Ob-Gyn): A doctor with special training and education in women's health. Peutz-Jeghers Syndrome: A genetic condition that increases a person's risk of cancer of the stomach, intestines, pancreas, cervix, ovary, and breast.

## 2017-12-09 NOTE — Progress Notes (Signed)
HPI:      [name] is a 42 y.o. G2P1011 who LMP was No LMP recorded. Patient has had an ablation., she presents today for her annual examination. The patient has no complaints today. The patient is sexually active. Her last pap: approximate date 2016 and was normal and last mammogram: approximate date 2019 and was normal. The patient does perform self breast exams.  There is notable family history of breast or ovarian cancer in her family. SISTER w OVARIAN CANCER (age 60 now, diagnosed years ago) The patient has regular exercise: yes.  The patient denies current symptoms of depression. Pt has new onset hot flashes, regularly.  No bleeding since ablation.   GYN History: Contraception: tubal ligation  PMHx: Past Medical History:  Diagnosis Date  . Carpal tunnel syndrome    RIGHT   . Dysfunctional uterine bleeding   . Environmental and seasonal allergies   . 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 Hx of mammogram 10/28/2012   Baseline Birad 1   Past Surgical History:  Procedure Laterality Date  . CESAREAN SECTION    . DILITATION & CURRETTAGE/HYSTROSCOPY WITH NOVASURE ABLATION N/A 12/09/2016   Procedure: DILATATION & CURETTAGE/HYSTEROSCOPY WITH NOVASURE ABLATION;  Surgeon: Gae Dry, MD;  Location: [facility] ORS;  Service: Gynecology;  Laterality: N/A;  . LAPAROSCOPIC BILATERAL SALPINGECTOMY Bilateral 12/09/2016   Procedure: LAPAROSCOPIC BILATERAL SALPINGECTOMY;  Surgeon: Gae Dry, MD;  Location: [facility] ORS;  Service: Gynecology;  Laterality: Bilateral;  . OTHER SURGICAL HISTORY  11/17/2014   vaginal biopsy (fibroepitheal polyp)  . WISDOM TOOTH EXTRACTION     Family History  Problem Relation Age of Onset  . Throat cancer Father   . CVA Father   . Lung cancer Father   . Ovarian cancer Sister 2  . Breast cancer Neg Hx    Social History   Tobacco Use  . Smoking status: Current Every Day Smoker    Packs/day: 0.50    Years: 17.00    Pack years: 8.50    Types:  Cigarettes  . Smokeless tobacco: Never Used  Substance Use Topics  . Alcohol use: Yes    Comment: occ  . Drug use: No    Current Outpatient Medications:  .  cetirizine (ZYRTEC) 10 MG tablet, Take 10 mg by mouth every morning. , Disp: , Rfl:  .  fluticasone (FLONASE) 50 MCG/ACT nasal spray, Place 1 spray into both nostrils daily., Disp: , Rfl:  .  ibuprofen (ADVIL,MOTRIN) 200 MG tablet, Take 200 mg by mouth every 8 (eight) hours as needed for mild pain., Disp: , Rfl:  .  Cyanocobalamin (B-12) 5000 MCG CAPS, Take 5,000 mcg by mouth daily., Disp: , Rfl:  .  diphenhydrAMINE (BENADRYL) 50 MG capsule, Take 50 mg by mouth every 6 (six) hours as needed., Disp: , Rfl:  .  UNABLE TO FIND, Take 1 Package by mouth daily. [place] Multi Dose Pack Multi-Vitamin D-3 : 1000 iu CoQ10 : 100 mg Cranberry : 400 mg Green Tea : 500 mg Vitamin C : 30 mg Fish Oil : 475 mg, Disp: , Rfl:  Allergies: Azelastine; Ciprofloxacin-ciproflox hcl er; Sulfa antibiotics; and Ciprofloxacin  Review of Systems  Constitutional: Negative for chills, fever and malaise/fatigue.  HENT: Negative for congestion, sinus pain and sore throat.   Eyes: Negative for blurred vision and pain.  Respiratory: Negative for cough and wheezing.   Cardiovascular: Negative for chest pain and leg swelling.  Gastrointestinal: Negative for abdominal pain, constipation, diarrhea, heartburn, nausea  and vomiting.  Genitourinary: Negative for dysuria, frequency, hematuria and urgency.  Musculoskeletal: Negative for back pain, joint pain, myalgias and neck pain.  Skin: Negative for itching and rash.  Neurological: Negative for dizziness, tremors and weakness.  Endo/Heme/Allergies: Does not bruise/bleed easily.  Psychiatric/Behavioral: Negative for depression. The patient is not nervous/anxious and does not have insomnia.     Objective: BP 100/60   Ht _0  (1.676 m)   Wt 150 lb (68 kg)   BMI 24.21 kg/m   Filed Weights   12/09/17 0940    Weight: 150 lb (68 kg)   Body mass index is 24.21 kg/m. Physical Exam  Constitutional: She is oriented to person, place, and time. She appears well-developed and well-nourished. No distress.  Genitourinary: Rectum normal, vagina normal and uterus normal. Pelvic exam was performed with patient supine. There is no rash or lesion on the right labia. There is no rash or lesion on the left labia. Vagina exhibits no lesion. No bleeding in the vagina. Right adnexum does not display mass and does not display tenderness. Left adnexum does not display mass and does not display tenderness. Cervix does not exhibit motion tenderness, lesion, friability or polyp.   Uterus is mobile and midaxial. Uterus is not enlarged or exhibiting a mass.  HENT:  Head: Normocephalic and atraumatic. Head is without laceration.  Right Ear: Hearing normal.  Left Ear: Hearing normal.  Nose: No epistaxis.  No foreign bodies.  Mouth/Throat: Uvula is midline, oropharynx is clear and moist and mucous membranes are normal.  Eyes: Pupils are equal, round, and reactive to light.  Neck: Normal range of motion. Neck supple. No thyromegaly present.  Cardiovascular: Normal rate and regular rhythm. Exam reveals no gallop and no friction rub.  No murmur heard. Pulmonary/Chest: Effort normal and breath sounds normal. No respiratory distress. She has no wheezes. Right breast exhibits no mass, no skin change and no tenderness. Left breast exhibits no mass, no skin change and no tenderness.  Abdominal: Soft. Bowel sounds are normal. She exhibits no distension. There is no tenderness. There is no rebound.  Musculoskeletal: Normal range of motion.  Neurological: She is alert and oriented to person, place, and time. No cranial nerve deficit.  Skin: Skin is warm and dry.  Psychiatric: She has a normal mood and affect. Judgment normal.  Vitals reviewed.  Assessment:  ANNUAL EXAM 1. Annual physical exam   2. Screening for cervical cancer    3. Hot flashes   4. Screening for diabetes mellitus   5. Screening cholesterol level   6. Encounter for vitamin deficiency screening   7. Family history of malignant neoplasm of ovary    Screening Plan:            1.  Cervical Screening-  Pap smear done today  2. Breast screening- Exam annually and mammogram>40 planned   3. Colonoscopy every 10 years, Hemoccult testing - after age 56  4. Labs Ordered today  5. Counseling for contraception: bilateral tubal ligation   6. Gene testing for FH Ovarian Cancer  She presents with a significant personal and/or family history of ovarian cancer sister 60 alive 75 years old. Details of which can be found in her medical/family history. She does not have a previously identified BRCA and Lynch syndrome mutation in her family. Due to her personal and/or family history of cancer she is a candidate for the [facility] test(s).    Risk for cancer, genetic susceptibility discussed.  Patient has requested gene testing.  Discussed BRCA as well as Lynch syndrome and other cancer risk assessments available based on her family history and personal history. Pros and cons of testing discussed.  A total of 15 minutes were spent face-to-face with the patient during this encounter and over half of that time dealt with counseling and coordination of care.    F/U  Return in about 1 year (around 12/10/2018) for Annual.  Barnett Applebaum, MD, [facility], [facility] 12/09/2017  10:02 AM

## 2017-12-10 LAB — LIPID PANEL
CHOLESTEROL TOTAL: 167 mg/dL (ref 100–199)
Chol/HDL Ratio: 3 ratio (ref 0.0–4.4)
HDL: 55 mg/dL (ref >39)
LDL Calculated: 94 mg/dL (ref 0–99)
Triglycerides: 89 mg/dL (ref 0–149)
VLDL Cholesterol Cal: 18 mg/dL (ref 5–40)

## 2017-12-10 LAB — FSH/LH
FSH: 6.6 m[IU]/mL
LH: 5.9 m[IU]/mL

## 2017-12-10 LAB — HEMOGLOBIN A1C
ESTIMATED AVERAGE GLUCOSE: 105 mg/dL
HEMOGLOBIN A1C: 5.3 % (ref 4.8–5.6)

## 2017-12-10 LAB — TSH: TSH: 0.758 u[IU]/mL (ref 0.450–4.500)

## 2017-12-10 LAB — CA 125: Cancer Antigen (CA) 125: 9.6 U/mL (ref 0.0–38.1)

## 2017-12-10 LAB — ESTRADIOL: Estradiol: 83.1 pg/mL

## 2017-12-10 LAB — VITAMIN D 25 HYDROXY (VIT D DEFICIENCY, FRACTURES): Vit D, 25-Hydroxy: 23.4 ng/mL — ABNORMAL LOW (ref 30.0–100.0)

## 2017-12-12 LAB — IGP, APTIMA HPV
HPV APTIMA: NEGATIVE
PAP Smear Comment: 0

## 2017-12-17 ENCOUNTER — Encounter: Payer: Self-pay | Admitting: Obstetrics and Gynecology

## 2017-12-24 ENCOUNTER — Encounter: Payer: Self-pay | Admitting: Obstetrics & Gynecology

## 2018-01-08 ENCOUNTER — Encounter: Payer: Self-pay | Admitting: Obstetrics & Gynecology

## 2018-07-21 ENCOUNTER — Other Ambulatory Visit: Payer: Self-pay | Admitting: Obstetrics & Gynecology

## 2018-07-21 DIAGNOSIS — Z1231 Encounter for screening mammogram for malignant neoplasm of breast: Secondary | ICD-10-CM

## 2018-07-27 ENCOUNTER — Other Ambulatory Visit: Payer: Self-pay

## 2018-07-27 ENCOUNTER — Encounter: Payer: Self-pay | Admitting: Emergency Medicine

## 2018-07-27 ENCOUNTER — Ambulatory Visit
Admission: EM | Admit: 2018-07-27 | Discharge: 2018-07-27 | Disposition: A | Discharge status: Home / Self Care | Payer: BLUE CROSS/BLUE SHIELD | Attending: Family Medicine | Admitting: Family Medicine

## 2018-07-27 DIAGNOSIS — M545 Low back pain, unspecified: Secondary | ICD-10-CM

## 2018-07-27 DIAGNOSIS — R350 Frequency of micturition: Secondary | ICD-10-CM | POA: Diagnosis not present

## 2018-07-27 DIAGNOSIS — B379 Candidiasis, unspecified: Secondary | ICD-10-CM | POA: Diagnosis not present

## 2018-07-27 DIAGNOSIS — B3749 Other urogenital candidiasis: Secondary | ICD-10-CM

## 2018-07-27 LAB — URINALYSIS, COMPLETE (UACMP) WITH MICROSCOPIC
Bacteria, UA: NONE SEEN
Bilirubin Urine: NEGATIVE
GLUCOSE, UA: NEGATIVE mg/dL
Ketones, ur: NEGATIVE mg/dL
Leukocytes, UA: NEGATIVE
NITRITE: NEGATIVE
Protein, ur: NEGATIVE mg/dL
Specific Gravity, Urine: 1.02 (ref 1.005–1.030)
WBC, UA: NONE SEEN WBC/hpf (ref 0–5)
pH: 7 (ref 5.0–8.0)

## 2018-07-27 MED ORDER — FLUCONAZOLE 200 MG PO TABS: 200.0000 mg | ORAL_TABLET | Freq: Every day | ORAL | 0 refills | Status: AC

## 2018-07-27 NOTE — ED Provider Notes (Signed)
[facility]    CSN: [id number] Arrival date & time: 07/27/18  1445     History   Chief Complaint Chief Complaint  Patient presents with  . Urinary Frequency  . Back Pain    HPI Deanna Torres is a 43 y.o. female.   Patient is a 43 year old female who presents with complaint of urinary urgency and low back pain that started this past weekend.  Patient reports frequent UTIs since her fallopian tube removal and uterine ablation back in January 2018.  She states she visited a "doctor on demand" around Christmas and was given a prescription for Augmentin.  She was told that she would need to come back in and she had recurrence of her symptoms.  Patient denies any burning states she normally discussed the low back pain and urgency.  Patient denies any bloody urine.  Patient reports allergies to Cipro which causes a rash and sulfa medications which gives her flulike symptoms.  She has been taking a daily cranberry tablet to help with her frequent UTIs.     Past Medical History:  Diagnosis Date  . BRCA negative 12/2017   MyRisk neg; IBIS=11.8%/riskscore=14.7%  . Carpal tunnel syndrome    RIGHT   . Dysfunctional uterine bleeding   . Environmental and seasonal allergies   . Family history of ovarian cancer   . 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 Hx of mammogram 10/28/2012   Baseline Birad 1    Patient Active Problem List   Diagnosis Date Noted  . Family history of malignant neoplasm of ovary 12/09/2017  . Admission for sterilization 12/09/2016    Past Surgical History:  Procedure Laterality Date  . CESAREAN SECTION    . DILITATION & CURRETTAGE/HYSTROSCOPY WITH NOVASURE ABLATION N/A 12/09/2016   Procedure: DILATATION & CURETTAGE/HYSTEROSCOPY WITH NOVASURE ABLATION;  Surgeon: Gae Dry, MD;  Location: [facility] ORS;  Service: Gynecology;  Laterality: N/A;  . LAPAROSCOPIC BILATERAL SALPINGECTOMY Bilateral 12/09/2016   Procedure: LAPAROSCOPIC BILATERAL SALPINGECTOMY;   Surgeon: Gae Dry, MD;  Location: [facility] ORS;  Service: Gynecology;  Laterality: Bilateral;  . OTHER SURGICAL HISTORY  11/17/2014   vaginal biopsy (fibroepitheal polyp)  . WISDOM TOOTH EXTRACTION      OB History    Gravida  2   Para  1   Term  1   Preterm      AB  1   Living  1     SAB      TAB      Ectopic      Multiple      Live Births               Home Medications    Prior to Admission medications   Medication Sig Start Date End Date Taking? Authorizing Provider  UNABLE TO FIND Take 1 Package by mouth daily. [place] Multi Dose Pack Multi-Vitamin D-3 : 1000 iu CoQ10 : 100 mg Cranberry : 400 mg Green Tea : 500 mg Vitamin C : 30 mg Fish Oil : 475 mg   Yes [provider]  cetirizine (ZYRTEC) 10 MG tablet Take 10 mg by mouth every morning.     [provider]  diphenhydrAMINE (BENADRYL) 50 MG capsule Take 50 mg by mouth every 6 (six) hours as needed.    [provider]  fluconazole (DIFLUCAN) 200 MG tablet Take 1 tablet (200 mg total) by mouth daily for 5 days. 07/27/18 08/01/18  Luvenia Redden, PA-C  fluticasone (FLONASE) 50 MCG/ACT  nasal spray Place 1 spray into both nostrils daily.    [provider]  ibuprofen (ADVIL,MOTRIN) 200 MG tablet Take 200 mg by mouth every 8 (eight) hours as needed for mild pain.    [provider]    Family History Family History  Problem Relation Age of Onset  . Throat cancer Father   . CVA Father   . Lung cancer Father   . Cervical cancer Sister   . Ovarian cancer Sister   . Breast cancer Neg Hx     Social History Social History   Tobacco Use  . Smoking status: Current Every Day Smoker    Packs/day: 0.50    Years: 17.00    Pack years: 8.50    Types: Cigarettes  . Smokeless tobacco: Never Used  Substance Use Topics  . Alcohol use: Yes    Comment: occ  . Drug use: No     Allergies   Azelastine; Ciprofloxacin-ciproflox hcl er; Sulfa antibiotics;  and Ciprofloxacin   Review of Systems Review of Systems as noted above in HPI.  Other systems reviewed and found to be negative.  Physical Exam Triage Vital Signs ED Triage Vitals  Enc Vitals Group     BP 07/27/18 1527 121/88     Pulse Rate 07/27/18 1527 92     Resp 07/27/18 1527 16     Temp 07/27/18 1527 98.5 F (36.9 C)     Temp Source 07/27/18 1527 Oral     SpO2 07/27/18 1527 100 %     Weight 07/27/18 1524 152 lb (68.9 kg)     Height 07/27/18 1524 _0  (1.676 m)     Head Circumference --      Peak Flow --      Pain Score 07/27/18 1524 5     Pain Loc --      Pain Edu? --      Excl. in [place]? --    No data found.  Updated Vital Signs BP 121/88 (BP Location: Left Arm)   Pulse 92   Temp 98.5 F (36.9 C) (Oral)   Resp 16   Ht _1  (1.676 m)   Wt 152 lb (68.9 kg)   SpO2 100%   BMI 24.53 kg/m   Physical Exam Constitutional:      Appearance: Normal appearance. She is not ill-appearing.  Eyes:     Extraocular Movements: Extraocular movements intact.  Neck:     Musculoskeletal: Normal range of motion.  Cardiovascular:     Rate and Rhythm: Regular rhythm.  Pulmonary:     Effort: Pulmonary effort is normal.     Breath sounds: Normal breath sounds.  Abdominal:     General: Abdomen is flat. There is no distension.     Palpations: There is no mass.     Tenderness: There is no abdominal tenderness. There is no right CVA tenderness, left CVA tenderness or rebound.  Musculoskeletal: Normal range of motion.  Skin:    General: Skin is warm.  Neurological:     General: No focal deficit present.     Mental Status: She is alert and oriented to person, place, and time.  Psychiatric:        Mood and Affect: Mood normal.        Behavior: Behavior normal.      UC Treatments / Results  Labs (all labs ordered are listed, but only abnormal results are displayed) Labs Reviewed  URINALYSIS, COMPLETE (UACMP) WITH MICROSCOPIC - Abnormal; Notable for  the following components:        Result Value   Hgb urine dipstick TRACE (*)    All other components within normal limits  URINE CULTURE    EKG None  Radiology No results found.  Procedures Procedures (including critical care time)  Medications Ordered in UC Medications - No data to display  Initial Impression / Assessment and Plan / UC Course  I have reviewed the triage vital signs and the nursing notes.  Pertinent labs & imaging results that were available during my care of the patient were reviewed by me and considered in my medical decision making (see chart for details).  Clinical Course as of Jul 27 1604  Tue Jul 27, 2018  1554 pH: 7.0 [MH]    Clinical Course User Index [MH] Luvenia Redden, PA-C    Patient reports frequent UTIs since her ablation of her uterus back in June 2018.  I do not see any recent culture results in the system.  UA had trace hemoglobin but no bacteria or white blood cells seen.  No leukocyte esterase or nitrites.  She did have some budding yeast.  We will go ahead and send her culture of her urine.  Given her symptoms and the budding yeast, we will give her prescription for fluconazole for 5 days.  Have her follow-up with there is no improvement. Final Clinical Impressions(s) / UC Diagnoses   Final diagnoses:  Urinary frequency  Bilateral low back pain without sciatica, unspecified chronicity  Candidiasis of urogenital site     Discharge Instructions     -fluconazole: one tablet daily for 5 days -push fluids -some yeast in urine but not indicative of bacterial infection. will send urine for culture -over the counter pyridium (Azo) for bladder pain if needed -follow up with PCP or this clinic if symptoms worsen or not improve   ED Prescriptions    Medication Sig Dispense Auth. Provider   fluconazole (DIFLUCAN) 200 MG tablet Take 1 tablet (200 mg total) by mouth daily for 5 days. 5 tablet Luvenia Redden, PA-C     Controlled Substance Prescriptions [place]  Controlled Substance Registry consulted? Not Applicable   Luvenia Redden, PA-C 07/27/18 1609

## 2018-07-27 NOTE — ED Triage Notes (Signed)
Patient c/o urinary frequency and lower back pain that started 2-3 days ago.

## 2018-07-27 NOTE — Discharge Instructions (Addendum)
-  fluconazole: one tablet daily for 5 days -push fluids -some yeast in urine but not indicative of bacterial infection. will send urine for culture -over the counter pyridium (Azo) for bladder pain if needed -follow up with PCP or this clinic if symptoms worsen or not improve

## 2018-07-29 LAB — URINE CULTURE

## 2018-07-30 ENCOUNTER — Encounter: Payer: Self-pay | Admitting: Obstetrics & Gynecology

## 2018-08-02 ENCOUNTER — Other Ambulatory Visit: Payer: Self-pay | Admitting: Obstetrics & Gynecology

## 2018-08-02 MED ORDER — FLUCONAZOLE 150 MG PO TABS: 150.0000 mg | ORAL_TABLET | Freq: Once | ORAL | 0 refills | Status: AC

## 2018-08-25 ENCOUNTER — Ambulatory Visit
Admission: RE | Admit: 2018-08-25 | Discharge: 2018-08-25 | Disposition: A | Discharge status: Home / Self Care | Payer: BLUE CROSS/BLUE SHIELD | Source: Ambulatory Visit | Attending: Obstetrics & Gynecology | Admitting: Obstetrics & Gynecology

## 2018-08-25 DIAGNOSIS — Z1231 Encounter for screening mammogram for malignant neoplasm of breast: Secondary | ICD-10-CM | POA: Diagnosis not present

## 2018-12-20 ENCOUNTER — Other Ambulatory Visit: Payer: BLUE CROSS/BLUE SHIELD

## 2018-12-20 ENCOUNTER — Encounter: Payer: Self-pay | Admitting: Obstetrics & Gynecology

## 2018-12-20 ENCOUNTER — Telehealth: Payer: Self-pay | Admitting: *Deleted

## 2018-12-20 ENCOUNTER — Ambulatory Visit (INDEPENDENT_AMBULATORY_CARE_PROVIDER_SITE_OTHER): Payer: BC Managed Care – PPO | Admitting: Obstetrics & Gynecology

## 2018-12-20 ENCOUNTER — Other Ambulatory Visit: Payer: Self-pay

## 2018-12-20 VITALS — BP 110/60 | Ht 66.0 in | Wt 156.0 lb

## 2018-12-20 DIAGNOSIS — Z20822 Contact with and (suspected) exposure to covid-19: Secondary | ICD-10-CM

## 2018-12-20 DIAGNOSIS — Z01419 Encounter for gynecological examination (general) (routine) without abnormal findings: Secondary | ICD-10-CM | POA: Diagnosis not present

## 2018-12-20 DIAGNOSIS — E559 Vitamin D deficiency, unspecified: Secondary | ICD-10-CM

## 2018-12-20 DIAGNOSIS — Z8041 Family history of malignant neoplasm of ovary: Secondary | ICD-10-CM

## 2018-12-20 DIAGNOSIS — Z1239 Encounter for other screening for malignant neoplasm of breast: Secondary | ICD-10-CM

## 2018-12-20 MED ORDER — CLOTRIMAZOLE-BETAMETHASONE 1-0.05 % EX CREA: 1.0000 "application " | TOPICAL_CREAM | Freq: Two times a day (BID) | CUTANEOUS | 0 refills | Status: DC

## 2018-12-20 NOTE — Patient Instructions (Signed)
PAP every three years Mammogram every year    Call [phone] to schedule at [place] (next Feb) Labs today

## 2018-12-20 NOTE — Progress Notes (Signed)
HPI:      [name] is a 43 y.o. G2P1011 who LMP was No LMP recorded. Patient has had an ablation., she presents today for her annual examination. The patient has no complaints today. The patient is sexually active. Her last pap: approximate date 2019 and was normal and last mammogram: approximate date 2020 (Feb) and was normal. The patient does perform self breast exams.  There is notable family history of breast or ovarian cancer in her family (OVARIAN CANCER; tested NEG for BRCA/MyRisk 2019).  The patient has regular exercise: yes.  The patient denies current symptoms of depression.    GYN History: Contraception: tubal ligation  PMHx: Past Medical History:  Diagnosis Date  . BRCA negative 12/2017   MyRisk neg; IBIS=11.8%/riskscore=14.7%  . Carpal tunnel syndrome    RIGHT   . Dysfunctional uterine bleeding   . Environmental and seasonal allergies   . Family history of ovarian cancer   . 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 Hx of mammogram 10/28/2012   Baseline Birad 1   Past Surgical History:  Procedure Laterality Date  . CESAREAN SECTION    . DILITATION & CURRETTAGE/HYSTROSCOPY WITH NOVASURE ABLATION N/A 12/09/2016   Procedure: DILATATION & CURETTAGE/HYSTEROSCOPY WITH NOVASURE ABLATION;  Surgeon: Gae Dry, MD;  Location: [facility] ORS;  Service: Gynecology;  Laterality: N/A;  . LAPAROSCOPIC BILATERAL SALPINGECTOMY Bilateral 12/09/2016   Procedure: LAPAROSCOPIC BILATERAL SALPINGECTOMY;  Surgeon: Gae Dry, MD;  Location: [facility] OR[facility];  Service: Gynecology;  Laterality: Bilateral;  . OTHER SURGICAL HISTORY  11/17/2014   vaginal biopsy (fibroepitheal polyp)  . WISDOM TOOTH EXTRACTION     Family History  Problem Relation Age of Onset  . Throat cancer Father   . CVA Father   . Lung cancer Father   . Cervical cancer Sister   . Ovarian cancer Sister   . Breast cancer Neg Hx    Social History   Tobacco Use  . Smoking status: Current Every Day Smoker   Packs/day: 0.50    Years: 17.00    Pack years: 8.50    Types: Cigarettes  . Smokeless tobacco: Never Used  Substance Use Topics  . Alcohol use: Yes    Comment: occ  . Drug use: No    Current Outpatient Medications:  .  cetirizine (ZYRTEC) 10 MG tablet, Take 10 mg by mouth every morning. , Disp: , Rfl:  .  clotrimazole-betamethasone (LOTRISONE) cream, Apply 1 application topically 2 (two) times daily., Disp: 30 g, Rfl: 0 .  fluticasone (FLONASE) 50 MCG/ACT nasal spray, Place 1 spray into both nostrils daily., Disp: , Rfl:  .  ibuprofen (ADVIL,MOTRIN) 200 MG tablet, Take 200 mg by mouth every 8 (eight) hours as needed for mild pain., Disp: , Rfl:  Allergies: Azelastine, Ciprofloxacin-ciproflox hcl er, Sulfa antibiotics, and Ciprofloxacin  Review of Systems  Constitutional: Negative for chills, fever and malaise/fatigue.  HENT: Negative for congestion, sinus pain and sore throat.   Eyes: Negative for blurred vision and pain.  Respiratory: Negative for cough and wheezing.   Cardiovascular: Negative for chest pain and leg swelling.  Gastrointestinal: Negative for abdominal pain, constipation, diarrhea, heartburn, nausea and vomiting.  Genitourinary: Negative for dysuria, frequency, hematuria and urgency.  Musculoskeletal: Negative for back pain, joint pain, myalgias and neck pain.  Skin: Negative for itching and rash.  Neurological: Negative for dizziness, tremors and weakness.  Endo/Heme/Allergies: Does not bruise/bleed easily.  Psychiatric/Behavioral: Negative for depression. The patient is not nervous/anxious and does not have insomnia.  Objective: BP 110/60   Ht 5' 6"  (1.676 m)   Wt 156 lb (70.8 kg)   BMI 25.18 kg/m   Filed Weights   12/20/18 0850  Weight: 156 lb (70.8 kg)   Body mass index is 25.18 kg/m. Physical Exam Constitutional:      General: She is not in acute distress.    Appearance: She is well-developed.  Genitourinary:     Pelvic exam was performed with  patient supine.     Vagina, uterus and rectum normal.     No lesions in the vagina.     No vaginal bleeding.     No cervical motion tenderness, friability, lesion or polyp.     Uterus is mobile.     Uterus is not enlarged.     No uterine mass detected.    Uterus is midaxial.     No right or left adnexal mass present.     Right adnexa not tender.     Left adnexa not tender.  HENT:     Head: Normocephalic and atraumatic. No laceration.     Right Ear: Hearing normal.     Left Ear: Hearing normal.     Mouth/Throat:     Pharynx: Uvula midline.  Eyes:     Pupils: Pupils are equal, round, and reactive to light.  Neck:     Musculoskeletal: Normal range of motion and neck supple.     Thyroid: No thyromegaly.  Cardiovascular:     Rate and Rhythm: Normal rate and regular rhythm.     Heart sounds: No murmur. No friction rub. No gallop.   Pulmonary:     Effort: Pulmonary effort is normal. No respiratory distress.     Breath sounds: Normal breath sounds. No wheezing.  Chest:     Breasts:        Right: No mass, skin change or tenderness.        Left: No mass, skin change or tenderness.  Abdominal:     General: Bowel sounds are normal. There is no distension.     Palpations: Abdomen is soft.     Tenderness: There is no abdominal tenderness. There is no rebound.  Musculoskeletal: Normal range of motion.  Neurological:     Mental Status: She is alert and oriented to person, place, and time.     Cranial Nerves: No cranial nerve deficit.  Skin:    General: Skin is warm and dry.  Psychiatric:        Judgment: Judgment normal.  Vitals signs reviewed.     Assessment:  ANNUAL EXAM 1. Women's annual routine gynecological examination   2. Screening for breast cancer   3. Family history of malignant neoplasm of ovary   4. Vitamin D deficiency      Screening Plan:            1.  Cervical Screening-  Pap smear schedule reviewed with patient  2. Breast screening- Exam annually and  mammogram>40 planned   3. Colonoscopy every 10 years, Hemoccult testing - after age 78  4. Labs Ordered today  5. Counseling for contraception: bilateral tubal ligation   6. Family history of malignant neoplasm of ovary US and CA125 next year and periodic for monitoring Pt counseled no set protocol for ovarian cancer screening MyRisk Neg last year  7. Vitamin D deficiency - VITAMIN D 25 Hydroxy (Vit-D Deficiency, Fractures)      F/U  Return in about 1 year (around 12/20/2019) for Annual.  Deanna Torres  Deanna Kingfisher, MD, [facility], [facility] 12/20/2018  9:11 AM

## 2018-12-20 NOTE — Telephone Encounter (Signed)
[facility]  Aletta Edouard- request testing for patient

## 2018-12-21 LAB — VITAMIN D 25 HYDROXY (VIT D DEFICIENCY, FRACTURES): Vit D, 25-Hydroxy: 30.7 ng/mL (ref 30.0–100.0)

## 2018-12-22 LAB — NOVEL CORONAVIRUS, NAA: SARS-CoV-2, NAA: NOT DETECTED

## 2019-02-13 ENCOUNTER — Ambulatory Visit
Admission: EM | Admit: 2019-02-13 | Discharge: 2019-02-13 | Disposition: A | Discharge status: Home / Self Care | Payer: BC Managed Care – PPO | Attending: Family Medicine | Admitting: Family Medicine

## 2019-02-13 ENCOUNTER — Other Ambulatory Visit: Payer: Self-pay

## 2019-02-13 ENCOUNTER — Encounter: Payer: Self-pay | Admitting: Emergency Medicine

## 2019-02-13 DIAGNOSIS — R300 Dysuria: Secondary | ICD-10-CM | POA: Diagnosis not present

## 2019-02-13 LAB — URINALYSIS, COMPLETE (UACMP) WITH MICROSCOPIC
Bilirubin Urine: NEGATIVE
Glucose, UA: NEGATIVE mg/dL
Ketones, ur: NEGATIVE mg/dL
Leukocytes,Ua: NEGATIVE
Nitrite: NEGATIVE
Protein, ur: NEGATIVE mg/dL
Specific Gravity, Urine: 1.02 (ref 1.005–1.030)
pH: 7 (ref 5.0–8.0)

## 2019-02-13 MED ORDER — FLUCONAZOLE 150 MG PO TABS: ORAL_TABLET | ORAL | 0 refills | Status: DC

## 2019-02-13 MED ORDER — PHENAZOPYRIDINE HCL 200 MG PO TABS: 200.0000 mg | ORAL_TABLET | Freq: Three times a day (TID) | ORAL | 0 refills | Status: DC

## 2019-02-13 MED ORDER — NITROFURANTOIN MONOHYD MACRO 100 MG PO CAPS: 100.0000 mg | ORAL_CAPSULE | Freq: Two times a day (BID) | ORAL | 0 refills | Status: DC

## 2019-02-13 NOTE — ED Triage Notes (Signed)
Patient states that she has had burning when urinating for the past 8 days.  Patient states that she finished her antibiotic yesterday.  Patient denies fevers.

## 2019-02-13 NOTE — ED Provider Notes (Addendum)
[place], [place]   Name: Deanna Torres DOB: 1976/02/27 MRN: [id number] CSN: [id number] PCP: [facility]  Arrival date and time:  02/13/19 1531  Chief Complaint:  Dysuria   NOTE: Prior to seeing the patient today, I have reviewed the triage nursing documentation and vital signs. Clinical staff has updated patient's PMH/PSHx, current medication list, and drug allergies/intolerances to ensure comprehensive history available to assist in medical decision making.   History:   HPI: Deanna Torres is a 43 y.o. female who presents today with complaints of urinary symptoms that began with acute onset 8 days ago.  Patient was seen for a telehealth visit a week ago and prescribed a seven-day course of cefdinir.  Antibiotic course completed yesterday, however patient remains symptomatic citing that she does not feel as if she is improved at all. She complains of dysuria, frequency, and urgency. She has not appreciated any gross hematuria, nor has she noticed her urine being malodorous. Patient denies any associated nausea, vomiting, fever, and chills. She has not experienced any pain in her lower back, flank area, or abdomen. Patient advises that she does not have a significant history for recurrent urinary tract infections.   Past Medical History:  Diagnosis Date  . BRCA negative 12/2017   MyRisk neg; IBIS=11.8%/riskscore=14.7%  . Carpal tunnel syndrome    RIGHT   . Dysfunctional uterine bleeding   . Environmental and seasonal allergies   . Family history of ovarian cancer   . 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 Hx of mammogram 10/28/2012   Baseline Birad 1    Past Surgical History:  Procedure Laterality Date  . CESAREAN SECTION    . DILITATION & CURRETTAGE/HYSTROSCOPY WITH NOVASURE ABLATION N/A 12/09/2016   Procedure: DILATATION & CURETTAGE/HYSTEROSCOPY WITH NOVASURE ABLATION;  Surgeon: Gae Dry, MD;  Location: [facility];  Service: Gynecology;  Laterality: N/A;  .  LAPAROSCOPIC BILATERAL SALPINGECTOMY Bilateral 12/09/2016   Procedure: LAPAROSCOPIC BILATERAL SALPINGECTOMY;  Surgeon: Gae Dry, MD;  Location: [facility];  Service: Gynecology;  Laterality: Bilateral;  . OTHER SURGICAL HISTORY  11/17/2014   vaginal biopsy (fibroepitheal polyp)  . WISDOM TOOTH EXTRACTION      Family History  Problem Relation Age of Onset  . Throat cancer Father   . CVA Father   . Lung cancer Father   . Cervical cancer Sister   . Ovarian cancer Sister   . Breast cancer Neg Hx     Social History   Tobacco Use  . Smoking status: Current Every Day Smoker    Packs/day: 0.50    Years: 17.00    Pack years: 8.50    Types: Cigarettes  . Smokeless tobacco: Never Used  Substance Use Topics  . Alcohol use: Yes    Comment: occ  . Drug use: No    Patient Active Problem List   Diagnosis Date Noted  . Vitamin D deficiency 12/20/2018  . Family history of malignant neoplasm of ovary 12/09/2017  . Admission for sterilization 12/09/2016    Home Medications:    Current Meds  Medication Sig  . cetirizine (ZYRTEC) 10 MG tablet Take 10 mg by mouth every morning.   . Cholecalciferol (VITAMIN D-1000 MAX ST) 25 MCG (1000 UT) tablet Take by mouth.  . fluticasone (FLONASE) 50 MCG/ACT nasal spray Place 1 spray into both nostrils daily.    Allergies:   Azelastine, Ciprofloxacin-ciproflox hcl er, Sulfa antibiotics, and Ciprofloxacin  Review of Systems (ROS): Review of Systems  Constitutional: Negative for chills  and fever.  Respiratory: Negative for cough and shortness of breath.   Cardiovascular: Negative for chest pain and palpitations.  Gastrointestinal: Negative for abdominal pain, diarrhea, nausea and vomiting.  Genitourinary: Positive for decreased urine volume (Voiding small amounts frequently), dysuria, frequency and urgency. Negative for hematuria, pelvic pain, vaginal bleeding, vaginal discharge and vaginal pain.  Musculoskeletal: Negative for back pain.   Skin: Negative for color change, pallor and rash.  Neurological: Negative for dizziness, weakness and headaches.  All other systems reviewed and are negative.    Vital Signs: Today's Vitals   02/13/19 1546 02/13/19 1550  BP:  109/80  Pulse:  89  Resp:  16  Temp:  98.2 F (36.8 C)  TempSrc:  Oral  SpO2:  100%  Weight: 148 lb (67.1 kg)   Height: 5' 6"  (1.676 m)   PainSc: 3      Physical Exam: Physical Exam  Constitutional: She is oriented to person, place, and time and well-developed, well-nourished, and in no distress.  HENT:  Head: Normocephalic and atraumatic.  Mouth/Throat: Mucous membranes are normal.  Eyes: Pupils are equal, round, and reactive to light. EOM are normal.  Cardiovascular: Normal rate, regular rhythm, normal heart sounds and intact distal pulses. Exam reveals no gallop and no friction rub.  No murmur heard. Pulmonary/Chest: Effort normal and breath sounds normal. No respiratory distress. She has no wheezes. She has no rales.  Abdominal: Soft. Normal appearance and bowel sounds are normal. There is no hepatosplenomegaly. There is no abdominal tenderness. There is no CVA tenderness.  Neurological: She is alert and oriented to person, place, and time. Gait normal. GCS score is 15.  Skin: Skin is warm and dry. No rash noted.  Psychiatric: Mood, memory, affect and judgment normal.  Nursing note and vitals reviewed.   Urgent Care Treatments / Results:   LABS: PLEASE NOTE: all labs that were ordered this encounter are listed, however only abnormal results are displayed. Labs Reviewed  URINALYSIS, COMPLETE (UACMP) WITH MICROSCOPIC - Abnormal; Notable for the following components:      Result Value   APPearance HAZY (*)    Hgb urine dipstick TRACE (*)    Bacteria, UA RARE (*)    All other components within normal limits  URINE CULTURE    EKG: -None  RADIOLOGY: No results found.  PROCEDURES: Procedures  MEDICATIONS RECEIVED THIS VISIT: Medications  - No data to display  PERTINENT CLINICAL COURSE NOTES/UPDATES:   Initial Impression / Assessment and Plan / Urgent Care Course:  Pertinent labs & imaging results that were available during my care of the patient were personally reviewed by me and considered in my medical decision making (see lab/imaging section of note for values and interpretations).  Deanna Torres is a 43 y.o. female who presents to [facility] today with complaints of Dysuria   Patient is well appearing overall in clinic today. She does not appear to be in any acute distress. Presenting symptoms (see HPI) and exam as documented above.  UA does not give concern for continued infection at this time. Sample (+) for a few WBCs and bacteria.  Will send for culture and sensitivity.  Patient noting that she does not feel any better despite recent antibiotic course.  Encouraged to increase fluid intake to flush urinary tract.  Will provide patient with a prescription for phenazopyridine to use on a PRN basis.  Patient will be contacted with culture and sensitivity results if positive.   Discussed follow up with primary  care physician in 1 week for re-evaluation. I have reviewed the follow up and strict return precautions for any new or worsening symptoms. Patient is aware of symptoms that would be deemed urgent/emergent, and would thus require further evaluation either here or in the emergency department. At the time of discharge, she verbalized understanding and consent with the discharge plan as it was reviewed with her. All questions were fielded by provider and/or clinic staff prior to patient discharge.    ADDENDUM: Patient was given a provision prescription for Macrobid in efforts to keep her from having to return. She notes in clinic that she was no better after her recent course of antibiotics. Patient was instructed to hold on to the prescription until she received a call from the clinic advising her to fill. She  verbalized understand that the prescription may need to be changed based on the C&S and that it should not be filled until directed to do so by the clinic.   Final Clinical Impressions / Urgent Care Diagnoses:   Final diagnoses:  Dysuria    New Prescriptions:  [place] Controlled Substance Registry consulted? Not Applicable  Meds ordered this encounter  Medications  . phenazopyridine (PYRIDIUM) 200 MG tablet    Sig: Take 1 tablet (200 mg total) by mouth 3 (three) times daily.    Dispense:  9 tablet    Refill:  0    Recommended Follow up Care:  Patient encouraged to follow up with the following provider within the specified time frame, or sooner as dictated by the severity of her symptoms. As always, she was instructed that for any urgent/emergent care needs, she should seek care either here or in the emergency department for more immediate evaluation.  Follow-up Information    [place] In 1 week.   Why: General reassessment of symptoms if not improving Contact information: [address] [place] [address] [place] [address] [phone]         NOTE: This note was prepared using Dragon dictation software along with smaller phrase technology. Despite my best ability to proofread, there is the potential that transcriptional errors may still occur from this process, and are completely unintentional.     Karen Kitchens, NP 02/13/19 1632    Karen Kitchens, NP 02/17/19 1132

## 2019-02-13 NOTE — Discharge Instructions (Addendum)
It was very nice seeing you today in clinic. Thank you for entrusting me with your care.   Your urine did not look infected. Will send for culture. I will provide the written prescriptions for you to use in the event that it comes back positive. Increase fluid intake as much as possible, with water being best.   Make arrangements to follow up with your regular doctor in 1 week for re-evaluation if not improving. If your symptoms/condition worsens, please seek follow up care either here or in the ER. Please remember, our [facility] providers are "right here with you" when you need [place].   Again, it was my pleasure to take care of you today. Thank you for choosing our clinic. I hope that you start to feel better quickly.   Honor Loh, MSN, APRN, FNP-C, CEN Advanced Practice Provider [facility]

## 2019-02-15 LAB — URINE CULTURE: Culture: NO GROWTH

## 2019-06-21 ENCOUNTER — Ambulatory Visit
Admission: EM | Admit: 2019-06-21 | Discharge: 2019-06-21 | Disposition: A | Discharge status: Home / Self Care | Payer: BC Managed Care – PPO | Attending: Family Medicine | Admitting: Family Medicine

## 2019-06-21 ENCOUNTER — Encounter: Payer: Self-pay | Admitting: Emergency Medicine

## 2019-06-21 ENCOUNTER — Other Ambulatory Visit: Payer: Self-pay

## 2019-06-21 DIAGNOSIS — B349 Viral infection, unspecified: Secondary | ICD-10-CM

## 2019-06-21 DIAGNOSIS — J3489 Other specified disorders of nose and nasal sinuses: Secondary | ICD-10-CM | POA: Diagnosis not present

## 2019-06-21 DIAGNOSIS — R197 Diarrhea, unspecified: Secondary | ICD-10-CM

## 2019-06-21 DIAGNOSIS — Z7189 Other specified counseling: Secondary | ICD-10-CM

## 2019-06-21 DIAGNOSIS — Z20822 Contact with and (suspected) exposure to covid-19: Secondary | ICD-10-CM

## 2019-06-21 NOTE — ED Provider Notes (Signed)
[place], [place]   Name: Deanna Torres DOB: 1976/05/03 MRN: [id number] CSN: [id number] PCP: [facility]  Arrival date and time:  06/21/19 4562  Chief Complaint:  covid exposure (APPT)   NOTE: Prior to seeing the patient today, I have reviewed the triage nursing documentation and vital signs. Clinical staff has updated patient's PMH/PSHx, current medication list, and drug allergies/intolerances to ensure comprehensive history available to assist in medical decision making.   History:   HPI: Deanna Torres is a 43 y.o. female who presents today with complaints of rhinorrhea chronic sinus problems that have been going on for a few days. Patient denies fevers. She denies any cough, shortness of breath, or wheezing. Patient also has a PMH significant for seasonal allergic rhinitis. Patient notes that allergy/sinus symptoms at baseline, however in the midst of the current pandemic, she wanted to be sure that it was not something more serious. No sore throat, facial pain, headaches, or otalgia. She denies that she has experienced any nausea, vomiting, or abdominal pain. She started having diarrhea last night; reports 3 episodes total. She is eating and drinking well. Patient denies any perceived alterations to her sense of taste or smell. 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 Patient has been in close direct contact with this individual. She denies being in close contact with anyone else known to be ill; no one else is her home has experienced a similar symptom constellation. She has not been tested for SARS-CoV-2 (novel coronavirus) in the past 14 days; last tested negative in June per her report. Patient has been vaccinated for influenza this season. In efforts to conservatively manage her symptoms at home, the patient notes that she has used cetirizine and  fluticasone, which has helped to improve her symptoms.    Past Medical History:  Diagnosis Date   BRCA negative 12/2017   MyRisk neg; IBIS=11.8%/riskscore=14.7%   Carpal tunnel syndrome    RIGHT    Dysfunctional uterine bleeding    Environmental and seasonal allergies    Family history of ovarian cancer    Fibroepithelial polyp 11/27/2014   Hx of mammogram 10/28/2012   Baseline Birad 1    Past Surgical History:  Procedure Laterality Date   CESAREAN SECTION     DILITATION & CURRETTAGE/HYSTROSCOPY WITH NOVASURE ABLATION N/A 12/09/2016   Procedure: DILATATION & CURETTAGE/HYSTEROSCOPY WITH NOVASURE ABLATION;  Surgeon: Gae Dry, MD;  Location: [facility] ORS;  Service: Gynecology;  Laterality: N/A;   LAPAROSCOPIC BILATERAL SALPINGECTOMY Bilateral 12/09/2016   Procedure: LAPAROSCOPIC BILATERAL SALPINGECTOMY;  Surgeon: Gae Dry, MD;  Location: [facility] ORS;  Service: Gynecology;  Laterality: Bilateral;   OTHER SURGICAL HISTORY  11/17/2014   vaginal biopsy (fibroepitheal polyp)   WISDOM TOOTH EXTRACTION      Family History  Problem Relation Age of Onset   Throat cancer Father    CVA Father    Lung cancer Father    Cervical cancer Sister    Ovarian cancer Sister    Heart attack Mother    Hypertension Mother    Hyperlipidemia Mother    Breast cancer Neg Hx     Social History   Tobacco Use   Smoking status: Current Every Day Smoker    Packs/day: 0.50    Years: 17.00    Pack years: 8.50    Types: Cigarettes   Smokeless tobacco: Never Used  Substance Use Topics  Alcohol use: Yes    Comment: occ   Drug use: No    Patient Active Problem List   Diagnosis Date Noted   Vitamin D deficiency 12/20/2018   Family history of malignant neoplasm of ovary 12/09/2017   Admission for sterilization 12/09/2016    Home Medications:    Current Meds  Medication Sig   cetirizine (ZYRTEC) 10 MG tablet Take 10 mg by mouth every morning.     Cholecalciferol (VITAMIN D-1000 MAX ST) 25 MCG (1000 UT) tablet Take by mouth.   ELDERBERRY PO Take 1 tablet by mouth daily.   fluticasone (FLONASE) 50 MCG/ACT nasal spray Place 1 spray into both nostrils daily.   ibuprofen (ADVIL,MOTRIN) 200 MG tablet Take 200 mg by mouth every 8 (eight) hours as needed for mild pain.    Allergies:   Azelastine, Ciprofloxacin-ciproflox hcl er, Sulfa antibiotics, and Ciprofloxacin  Review of Systems (ROS): Review of Systems  Constitutional: Negative for fatigue and fever.  HENT: Positive for rhinorrhea. Negative for congestion, ear pain, postnasal drip, sinus pressure, sinus pain, sneezing and sore throat.   Eyes: Negative for pain, discharge and redness.  Respiratory: Negative for cough, chest tightness and shortness of breath.   Cardiovascular: Negative for chest pain and palpitations.  Gastrointestinal: Positive for diarrhea. Negative for abdominal pain, nausea and vomiting.  Musculoskeletal: Negative for arthralgias, back pain, myalgias and neck pain.  Skin: Negative for color change, pallor and rash.  Allergic/Immunologic: Positive for environmental allergies (seasonal).  Neurological: Negative for dizziness, syncope, weakness and headaches.  Hematological: Negative for adenopathy.     Vital Signs: Today's Vitals   06/21/19 0909 06/21/19 0910 06/21/19 0936  BP: 120/83    Pulse: 89    Resp: 18    Temp: 98.2 F (36.8 C)    TempSrc: Oral    SpO2: 100%    Weight:  146 lb (66.2 kg)   Height:  5' 6"  (1.676 m)   PainSc:  0-No pain 9     Physical Exam: Physical Exam  Constitutional: She is oriented to person, place, and time and well-developed, well-nourished, and in no distress.  HENT:  Head: Normocephalic and atraumatic.  Nose: Rhinorrhea present. No mucosal edema.  Mouth/Throat: Uvula is midline. Posterior oropharyngeal erythema (mild with (+) clear PND) present. No oropharyngeal exudate or posterior oropharyngeal edema.  Eyes: Pupils  are equal, round, and reactive to light.  Cardiovascular: Normal rate, regular rhythm, normal heart sounds and intact distal pulses.  Pulmonary/Chest: Effort normal and breath sounds normal.  Neurological: She is alert and oriented to person, place, and time. Gait normal.  Skin: Skin is warm and dry. No rash noted. She is not diaphoretic.  Psychiatric: Mood, memory, affect and judgment normal.  Nursing note and vitals reviewed.   Urgent Care Treatments / Results:  LABS: PLEASE NOTE: all labs that were ordered this encounter are listed, however only abnormal results are displayed. Labs Reviewed  NOVEL CORONAVIRUS, NAA (HOSP ORDER, SEND-OUT TO REF LAB; TAT 18-24 HRS)    EKG: -None  RADIOLOGY: No results found.  PROCEDURES: Procedures  MEDICATIONS RECEIVED THIS VISIT: Medications - No data to display  PERTINENT CLINICAL COURSE NOTES/UPDATES:   Initial Impression / Assessment and Plan / Urgent Care Course:  Pertinent labs & imaging results that were available during my care of the patient were personally reviewed by me and considered in my medical decision making (see lab/imaging section of note for values and interpretations).  Deanna Torres is a 43 y.o. female  who presents to [facility] today with complaints of covid exposure (APPT)   Patient overall well appearing and in no acute distress today in clinic. Presenting symptoms (see HPI) and exam as documented above. She presents with symptoms associated with SARS-CoV-2 (novel coronavirus). Discussed typical symptom constellation. Reviewed potential for infection and need for testing. Patient amenable to being tested. SARS-CoV-2 swab collected by certified clinical staff. Discussed variable turn around times associated with testing, as swabs are being processed at [facility], and have been taking between 2-5 days to come back. She was advised to self quarantine, per [facility] DHHS guidelines, until negative results received. These  measures are being implemented out of an abundance of caution to prevent transmission and spread during the current SARS-CoV-2 pandemic.  Presenting symptoms consistent with acute viral illness. Until ruled out with confirmatory lab testing, SARS-CoV-2 remains part of the differential. Her testing is pending at this time. I discussed with her that her symptoms are felt to be viral in nature, thus antibiotics would not offer her any relief or improve his symptoms any faster than conservative symptomatic management. Patient to continue cetirizine and fluticasone. Advised that could add loperamide if diarrhea became significant. Discussed supportive care measures at home during acute phase of illness. Patient to rest as much as possible. She was encouraged to ensure adequate hydration (water and ORS) to prevent dehydration and electrolyte derangements. Patient may use APAP and/or IBU on an as needed basis for pain/fever.    Current clinical condition warrants patient being out of work in order to quarantine while waiting for testing results. She was provided with the appropriate documentation to provide to her place of employment that will allow for her to RTW on 06/23/2019 with no restrictions. RTW is contingent on her SARS-CoV-2 test results being reviewed as negative.   Discussed follow up with primary care physician in 1 week for re-evaluation. I have reviewed the follow up and strict return precautions for any new or worsening symptoms. Patient is aware of symptoms that would be deemed urgent/emergent, and would thus require further evaluation either here or in the emergency department. At the time of discharge, she verbalized understanding and consent with the discharge plan as it was reviewed with her. All questions were fielded by provider and/or clinic staff prior to patient discharge.    Final Clinical Impressions / Urgent Care Diagnoses:   Final diagnoses:  Viral illness  Encounter for laboratory  testing for COVID-19 virus  Advice given about COVID-19 virus infection    New Prescriptions:  [place] Controlled Substance Registry consulted? Not Applicable  No orders of the defined types were placed in this encounter.   Recommended Follow up Care:  Patient encouraged to follow up with the following provider within the specified time frame, or sooner as dictated by the severity of her symptoms. As always, she was instructed that for any urgent/emergent care needs, she should seek care either here or in the emergency department for more immediate evaluation.  Follow-up Information    [place], [facility] In 1 week.   Contact information: [place] [address] [phone]         NOTE: This note was prepared using Dragon dictation software along with smaller phrase technology. Despite my best ability to proofread, there is the potential that transcriptional errors may still occur from this process, and are completely unintentional.    Karen Kitchens, NP 06/21/19 2149

## 2019-06-21 NOTE — Discharge Instructions (Addendum)
It was very nice seeing you today in clinic. Thank you for entrusting me with your care.   Rest and Stay HYDRATED. Water and electrolyte containing beverages (Gatorade, Pedialyte) are best to prevent dehydration and electrolyte abnormalities. Continue Zyrtec and Flonase. May add Imodium if diarrhea worsens. May use Tylenol and/or Ibuprofen as needed for pain/fever.   You were tested for SARS-CoV-2 (novel coronavirus) today. Testing is performed by an outside lab ([facility]) and has variable turn around times ranging between 2-5 days. Current recommendations from the the CDC and [place] DHHS require that you remain out of work in order to quarantine at home until negative test results are have been received. In the event that your test results are positive, you will be contacted with further directives. These measures are being implemented out of an abundance of caution to prevent transmission and spread during the current SARS-CoV-2 pandemic.  Make arrangements to follow up with your regular doctor in 1 week for re-evaluation if not improving. If your symptoms/condition worsens, please seek follow up care either here or in the ER. Please remember, our [facility] providers are "right here with you" when you need [place].   Again, it was my pleasure to take care of you today. Thank you for choosing our clinic. I hope that you start to feel better quickly.   Honor Loh, MSN, APRN, FNP-C, CEN Advanced Practice Provider [facility]

## 2019-06-21 NOTE — ED Triage Notes (Signed)
Patient in today after being exposed to covid at work last week. Patient states she has had diarrhea last night and this morning and does have sinus problems so unsure if those are related to covid.

## 2019-06-22 LAB — NOVEL CORONAVIRUS, NAA (HOSP ORDER, SEND-OUT TO REF LAB; TAT 18-24 HRS): SARS-CoV-2, NAA: NOT DETECTED

## 2019-07-21 ENCOUNTER — Other Ambulatory Visit: Payer: Self-pay | Admitting: Obstetrics & Gynecology

## 2019-07-21 MED ORDER — FLUCONAZOLE 150 MG PO TABS: 150.0000 mg | ORAL_TABLET | Freq: Once | ORAL | 1 refills | Status: AC

## 2019-08-30 ENCOUNTER — Other Ambulatory Visit: Payer: Self-pay

## 2019-08-30 ENCOUNTER — Ambulatory Visit
Admission: RE | Admit: 2019-08-30 | Discharge: 2019-08-30 | Disposition: A | Discharge status: Home / Self Care | Payer: BC Managed Care – PPO | Source: Ambulatory Visit | Attending: Obstetrics & Gynecology | Admitting: Obstetrics & Gynecology

## 2019-08-30 DIAGNOSIS — Z1231 Encounter for screening mammogram for malignant neoplasm of breast: Secondary | ICD-10-CM | POA: Insufficient documentation

## 2019-08-30 DIAGNOSIS — Z1239 Encounter for other screening for malignant neoplasm of breast: Secondary | ICD-10-CM

## 2019-10-02 ENCOUNTER — Ambulatory Visit: Payer: BC Managed Care – PPO | Attending: Internal Medicine

## 2019-10-02 ENCOUNTER — Other Ambulatory Visit: Payer: Self-pay

## 2019-10-02 DIAGNOSIS — Z23 Encounter for immunization: Secondary | ICD-10-CM

## 2019-10-02 NOTE — Progress Notes (Signed)
   Covid-19 Vaccination Clinic  Name:  Deanna Torres    MRN: [id number] DOB: [birth year]  10/02/2019  [name] was observed post Covid-19 immunization for 15 minutes without incident. She was provided with Vaccine Information Sheet and instruction to access the V-Safe system.   [name] was instructed to call 911 with any severe reactions post vaccine: Marland Kitchen Difficulty breathing  . Swelling of face and throat  . A fast heartbeat  . A bad rash all over body  . Dizziness and weakness   Immunizations Administered    Name Date Dose VIS Date Route   Pfizer COVID-19 Vaccine 10/02/2019  9:52 AM 0.3 mL 06/17/2019 Intramuscular   Manufacturer: [employer], [employer]   Lot: [id number]   NDC: [address]-4

## 2019-10-26 ENCOUNTER — Ambulatory Visit: Payer: BC Managed Care – PPO | Attending: Internal Medicine

## 2019-10-26 DIAGNOSIS — Z23 Encounter for immunization: Secondary | ICD-10-CM

## 2019-10-26 NOTE — Progress Notes (Signed)
   Covid-19 Vaccination Clinic  Name:  Deanna Torres    MRN: [id number] DOB: 1975-12-20  10/26/2019  [name] was observed post Covid-19 immunization for 15 minutes without incident. She was provided with Vaccine Information Sheet and instruction to access the V-Safe system.   [name] was instructed to call 911 with any severe reactions post vaccine: Marland Kitchen Difficulty breathing  . Swelling of face and throat  . A fast heartbeat  . A bad rash all over body  . Dizziness and weakness   Immunizations Administered    Name Date Dose VIS Date Route   Pfizer COVID-19 Vaccine 10/26/2019  9:03 AM 0.3 mL 08/31/2018 Intramuscular   Manufacturer: [employer], [employer]   Lot: [id number]   NDC: [address]-0

## 2019-12-21 ENCOUNTER — Ambulatory Visit: Payer: BC Managed Care – PPO | Admitting: Obstetrics & Gynecology

## 2020-01-06 ENCOUNTER — Ambulatory Visit (INDEPENDENT_AMBULATORY_CARE_PROVIDER_SITE_OTHER): Payer: BC Managed Care – PPO | Admitting: Obstetrics & Gynecology

## 2020-01-06 ENCOUNTER — Other Ambulatory Visit: Payer: Self-pay

## 2020-01-06 ENCOUNTER — Encounter: Payer: Self-pay | Admitting: Obstetrics & Gynecology

## 2020-01-06 VITALS — BP 110/70 | Ht 66.0 in | Wt 148.0 lb

## 2020-01-06 DIAGNOSIS — Z8041 Family history of malignant neoplasm of ovary: Secondary | ICD-10-CM

## 2020-01-06 DIAGNOSIS — Z1231 Encounter for screening mammogram for malignant neoplasm of breast: Secondary | ICD-10-CM | POA: Diagnosis not present

## 2020-01-06 DIAGNOSIS — Z01419 Encounter for gynecological examination (general) (routine) without abnormal findings: Secondary | ICD-10-CM | POA: Diagnosis not present

## 2020-01-06 NOTE — Progress Notes (Signed)
HPI:      [name] is a 44 y.o. G2P1011 who LMP was No LMP recorded. Patient has had an ablation., she presents today for her annual examination. The patient has no complaints today. The patient is sexually active. Her last pap: approximate date 2019 and was normal and last mammogram: approximate date 2021 and was normal. The patient does perform self breast exams.  There is notable family history of breast or ovarian cancer in her family.  Ovarian cancer, sister.  Pt is BRCA Neg.The patient has regular exercise: yes.  The patient denies current symptoms of depression.    GYN History: Contraception: tubal ligation  PMHx: Past Medical History:  Diagnosis Date  . BRCA negative 12/2017   MyRisk neg; IBIS=11.8%/riskscore=14.7%  . Carpal tunnel syndrome    RIGHT   . Dysfunctional uterine bleeding   . Environmental and seasonal allergies   . Family history of ovarian cancer   . 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 Hx of mammogram 10/28/2012   Baseline Birad 1   Past Surgical History:  Procedure Laterality Date  . CESAREAN SECTION    . DILITATION & CURRETTAGE/HYSTROSCOPY WITH NOVASURE ABLATION N/A 12/09/2016   Procedure: DILATATION & CURETTAGE/HYSTEROSCOPY WITH NOVASURE ABLATION;  Surgeon: Gae Dry, MD;  Location: [facility] ORS;  Service: Gynecology;  Laterality: N/A;  . LAPAROSCOPIC BILATERAL SALPINGECTOMY Bilateral 12/09/2016   Procedure: LAPAROSCOPIC BILATERAL SALPINGECTOMY;  Surgeon: Gae Dry, MD;  Location: [facility] ORS;  Service: Gynecology;  Laterality: Bilateral;  . OTHER SURGICAL HISTORY  11/17/2014   vaginal biopsy (fibroepitheal polyp)  . WISDOM TOOTH EXTRACTION     Family History  Problem Relation Age of Onset  . Throat cancer Father   . CVA Father   . Lung cancer Father   . Cervical cancer Sister   . Ovarian cancer Sister   . Heart attack Mother   . Hypertension Mother   . Hyperlipidemia Mother   . Breast cancer Neg Hx    Social History   Tobacco  Use  . Smoking status: Current Every Day Smoker    Packs/day: 0.50    Years: 17.00    Pack years: 8.50    Types: Cigarettes  . Smokeless tobacco: Never Used  Vaping Use  . Vaping Use: Never used  Substance Use Topics  . Alcohol use: Yes    Comment: occ  . Drug use: No    Current Outpatient Medications:  .  cetirizine (ZYRTEC) 10 MG tablet, Take 10 mg by mouth every morning. , Disp: , Rfl:  .  Cholecalciferol (VITAMIN D-1000 MAX ST) 25 MCG (1000 UT) tablet, Take by mouth. , Disp: , Rfl:  .  ELDERBERRY PO, Take 1 tablet by mouth daily. (Patient not taking: Reported on 01/06/2020), Disp: , Rfl:  .  fluticasone (FLONASE) 50 MCG/ACT nasal spray, Place 1 spray into both nostrils daily. (Patient not taking: Reported on 01/06/2020), Disp: , Rfl:  .  ibuprofen (ADVIL,MOTRIN) 200 MG tablet, Take 200 mg by mouth every 8 (eight) hours as needed for mild pain. (Patient not taking: Reported on 01/06/2020), Disp: , Rfl:  Allergies: Azelastine, Ciprofloxacin-ciproflox hcl er, Sulfa antibiotics, and Ciprofloxacin  Review of Systems  Constitutional: Negative for chills, fever and malaise/fatigue.  HENT: Negative for congestion, sinus pain and sore throat.   Eyes: Negative for blurred vision and pain.  Respiratory: Negative for cough and wheezing.   Cardiovascular: Negative for chest pain and leg swelling.  Gastrointestinal: Negative for abdominal pain, constipation, diarrhea, heartburn,  nausea and vomiting.  Genitourinary: Negative for dysuria, frequency, hematuria and urgency.  Musculoskeletal: Negative for back pain, joint pain, myalgias and neck pain.  Skin: Negative for itching and rash.  Neurological: Negative for dizziness, tremors and weakness.  Endo/Heme/Allergies: Does not bruise/bleed easily.  Psychiatric/Behavioral: Negative for depression. The patient is not nervous/anxious and does not have insomnia.     Objective: BP 110/70   Ht 5' 6"  (1.676 m)   Wt 148 lb (67.1 kg)   BMI 23.89 kg/m    Filed Weights   01/06/20 0955  Weight: 148 lb (67.1 kg)   Body mass index is 23.89 kg/m. Physical Exam Constitutional:      General: She is not in acute distress.    Appearance: She is well-developed.  Genitourinary:     Pelvic exam was performed with patient supine.     Vagina, uterus and rectum normal.     No lesions in the vagina.     No vaginal bleeding.     No cervical motion tenderness, friability, lesion or polyp.     Uterus is mobile.     Uterus is not enlarged.     No uterine mass detected.    Uterus is midaxial.     No right or left adnexal mass present.     Right adnexa not tender.     Left adnexa not tender.  HENT:     Head: Normocephalic and atraumatic. No laceration.     Right Ear: Hearing normal.     Left Ear: Hearing normal.     Mouth/Throat:     Pharynx: Uvula midline.  Eyes:     Pupils: Pupils are equal, round, and reactive to light.  Neck:     Thyroid: No thyromegaly.  Cardiovascular:     Rate and Rhythm: Normal rate and regular rhythm.     Heart sounds: No murmur heard.  No friction rub. No gallop.   Pulmonary:     Effort: Pulmonary effort is normal. No respiratory distress.     Breath sounds: Normal breath sounds. No wheezing.  Chest:     Breasts:        Right: No mass, skin change or tenderness.        Left: No mass, skin change or tenderness.  Abdominal:     General: Bowel sounds are normal. There is no distension.     Palpations: Abdomen is soft.     Tenderness: There is no abdominal tenderness. There is no rebound.  Musculoskeletal:        General: Normal range of motion.     Cervical back: Normal range of motion and neck supple.  Neurological:     Mental Status: She is alert and oriented to person, place, and time.     Cranial Nerves: No cranial nerve deficit.  Skin:    General: Skin is warm and dry.  Psychiatric:        Judgment: Judgment normal.  Vitals reviewed.     Assessment:  ANNUAL EXAM 1. Women's annual routine  gynecological examination   2. Family history of malignant neoplasm of ovary      Screening Plan:            1.  Cervical Screening-  Pap smear schedule reviewed with patient  2. Breast screening- Exam annually and mammogram>40 planned   3. Colonoscopy every 10 years, Hemoccult testing - after age 46  4. Labs UTD, plan next year  5. Counseling for contraception: bilateral tubal ligation  6. Family history of malignant neoplasm of ovary Screening limitations and options discussed CA125 [place]  Prior BRCA Neg      F/U  Return in about 2 weeks (around 01/20/2020) for Follow up w GYN [place].  Barnett Applebaum, MD, [facility], [facility] 01/06/2020  10:42 AM

## 2020-01-06 NOTE — Patient Instructions (Addendum)
PAP every three years Mammogram every year    Call [phone] to schedule at [facility] next year  Thank you for choosing [facility]. As part of our ongoing efforts to improve patient experience, we would appreciate your feedback. Please fill out the short survey that you will receive by mail or MyChart. Your opinion is important to [place]!   Transvaginal Ultrasound A transvaginal ultrasound, also called an endovaginal ultrasound, is a test that uses sound waves to take pictures of the female genital tract. The pictures are taken with a device, called a transducer, that is placed in the vagina. This test may be done to:  Check for problems with your pregnancy.  Check your developing baby.  Check for anything abnormal in the uterus or ovaries.  Find out why you have pelvic pain or bleeding. Tell a health care provider about:  Any allergies you have.  All medicines you are taking, including vitamins, herbs, eye drops, creams, and over-the-counter medicines.  Any blood disorders you have.  Any surgeries you have had.  Any medical conditions you have.  Whether you are pregnant or may be pregnant.  Whether you are having your menstrual period. What are the risks? This is a safe procedure. There are no known risks or complications of having this test. What happens before the procedure? This procedure needs to be done when your bladder is empty. Follow your health care provider's instructions about drinking fluids and emptying your bladder before the test. What happens during the procedure?   You will empty your bladder before the procedure.  You will undress from the waist down.  You will lie down on an exam table, with your knees bent and your feet in foot holders.  A health care provider will cover the transducer with a sterile cover.  A gel will be put on the transducer. The gel helps transmit the sound waves and prevents irritation of your vagina.  The technician  will insert the transducer into your vagina to get images. These will be displayed on a monitor that looks like a small television screen.  The transducer will be removed when the procedure is complete. The procedure may vary among health care providers and hospitals. What happens after the procedure?  It is up to you to get the results of your procedure. Ask your health care provider, or the department that is doing the procedure, when your results will be ready.  Keep all follow-up visits as told by your health care provider. This is important. Summary  A transvaginal ultrasound, also called an endovaginal ultrasound, is a test that uses sound waves to take pictures of the female genital tract.  This is a safe procedure. There are no known risks associated with this test.  The procedure needs to be done when your bladder is empty. Follow your health care provider's instructions about drinking fluids and emptying your bladder before the test.  During the procedure, you will undress from the waist down and lie down on an exam table. A technician will insert a transducer into your vagina to obtain images.  Ask your health care provider, or the department that is doing the procedure, when your results will be ready. This information is not intended to replace advice given to you by your health care provider. Make sure you discuss any questions you have with your health care provider. Document Revised: 02/03/2018 Document Reviewed: 02/03/2018 Elsevier Patient Education  2020 [employer].

## 2020-01-07 LAB — CA 125: Cancer Antigen (CA) 125: 7.4 U/mL (ref 0.0–38.1)

## 2020-01-20 ENCOUNTER — Ambulatory Visit (INDEPENDENT_AMBULATORY_CARE_PROVIDER_SITE_OTHER): Payer: BC Managed Care – PPO | Admitting: Obstetrics & Gynecology

## 2020-01-20 ENCOUNTER — Encounter: Payer: Self-pay | Admitting: Obstetrics & Gynecology

## 2020-01-20 ENCOUNTER — Other Ambulatory Visit: Payer: Self-pay

## 2020-01-20 ENCOUNTER — Ambulatory Visit (INDEPENDENT_AMBULATORY_CARE_PROVIDER_SITE_OTHER): Payer: BC Managed Care – PPO

## 2020-01-20 VITALS — BP 110/70 | Ht 66.0 in | Wt 148.0 lb

## 2020-01-20 DIAGNOSIS — Z8041 Family history of malignant neoplasm of ovary: Secondary | ICD-10-CM

## 2020-01-20 NOTE — Progress Notes (Signed)
  HPI: Pt has FN ovarian cancer.  SHe herself is BRCA NEG.  No recent pain, bloating, weight changes.  Ultrasound demonstrates no masses seen, normal.  PMHx: She  has a past medical history of BRCA negative (12/2017), Carpal tunnel syndrome, Dysfunctional uterine bleeding, Environmental and seasonal allergies, Family history of ovarian cancer, Fibroepithelial polyp (11/27/2014), and mammogram (10/28/2012). Also,  has a past surgical history that includes Cesarean section; OTHER SURGICAL HISTORY (11/17/2014); Wisdom tooth extraction; Laparoscopic bilateral salpingectomy (Bilateral, 12/09/2016); and Dilatation & currettage/hysteroscopy with novasure ablation (N/A, 12/09/2016)., family history includes CVA in her father; Cervical cancer in her sister; Heart attack in her mother; Hyperlipidemia in her mother; Hypertension in her mother; Lung cancer in her father; Ovarian cancer in her sister; Throat cancer in her father.,  reports that she has been smoking cigarettes. She has a 8.50 pack-year smoking history. She has never used smokeless tobacco. She reports current alcohol use. She reports that she does not use drugs.  She has a current medication list which includes the following prescription(s): cetirizine, vitamin d-1000 max st, elderberry, fluticasone, and ibuprofen. Also, is allergic to azelastine, ciprofloxacin-ciproflox hcl er, sulfa antibiotics, and ciprofloxacin.  Review of Systems  All other systems reviewed and are negative.   Objective: BP 110/70   Ht _0  (1.676 m)   Wt 148 lb (67.1 kg)   BMI 23.89 kg/m   Physical examination Constitutional NAD, Conversant  Skin No rashes, lesions or ulceration.   Extremities: Moves all appropriately.  Normal ROM for age. No lymphadenopathy.  Neuro: Grossly intact  Psych: Oriented to PPT.  Normal mood. Normal affect.   US PELVIC COMPLETE WITH TRANSVAGINAL  Result Date: 01/20/2020 Patient Name: Deanna Torres DOB: 08-26-1975 MRN: [id number]  ULTRASOUND REPORT Location: [facility] Date of Service: 01/20/2020 Indications: Family History of Ovarian Cancer Findings: The uterus is anteverted and measures 7.7 x 4.3 x 3.4 cm. Echo texture is homogenous without evidence of focal masses. The Endometrium measures 2.0 mm. Right Ovary measures 2.6 x 2.1 x 1.2 cm. It is normal in appearance. Left Ovary measures 2.7 x 2.2 x 1.4 cm. It is normal in appearance. Survey of the adnexa demonstrates no adnexal masses. There is no free fluid in the cul de sac. Impression: 1. Normal pelvic ultrasound. Recommendations: 1.Clinical correlation with the patient's History and Physical Exam. Gweneth Dimitri, RT Review of ULTRASOUND.    I have personally reviewed images and report of recent ultrasound done at [facility].    Plan of management to be discussed with patient. Deanna Applebaum, MD, [facility], [facility] 01/20/2020  11:16 AM   Assessment:  Family history of malignant neoplasm of ovary  Normal CA125 and [place] as a screen/look for early warning signs of ovarian cancer  A total of 12 minutes were spent face-to-face with the patient as well as preparation, review, communication, and documentation during this encounter.   Deanna Applebaum, MD, [facility], [facility] 01/20/2020  11:16 AM

## 2020-03-08 ENCOUNTER — Other Ambulatory Visit: Payer: Self-pay

## 2020-03-08 ENCOUNTER — Ambulatory Visit
Admission: EM | Admit: 2020-03-08 | Discharge: 2020-03-08 | Disposition: A | Discharge status: Home / Self Care | Payer: BC Managed Care – PPO | Attending: Family Medicine | Admitting: Family Medicine

## 2020-03-08 DIAGNOSIS — Z20822 Contact with and (suspected) exposure to covid-19: Secondary | ICD-10-CM | POA: Insufficient documentation

## 2020-03-08 NOTE — Discharge Instructions (Signed)

## 2020-03-08 NOTE — ED Triage Notes (Signed)
Patient here for COVID testing. Indirect exposure, no symptoms.

## 2020-03-09 LAB — SARS CORONAVIRUS 2 (TAT 6-24 HRS): SARS Coronavirus 2: NEGATIVE

## 2020-12-08 IMAGING — MG DIGITAL SCREENING BILAT W/ TOMO W/ CAD
6 of 10 series · 6 of 30 positions shown · non-contrast
Comparison: Previous exam(s).

CLINICAL DATA: Screening.

EXAM:
DIGITAL SCREENING BILATERAL MAMMOGRAM WITH TOMO AND CAD

[R CC synth-2D (1 of 2)]
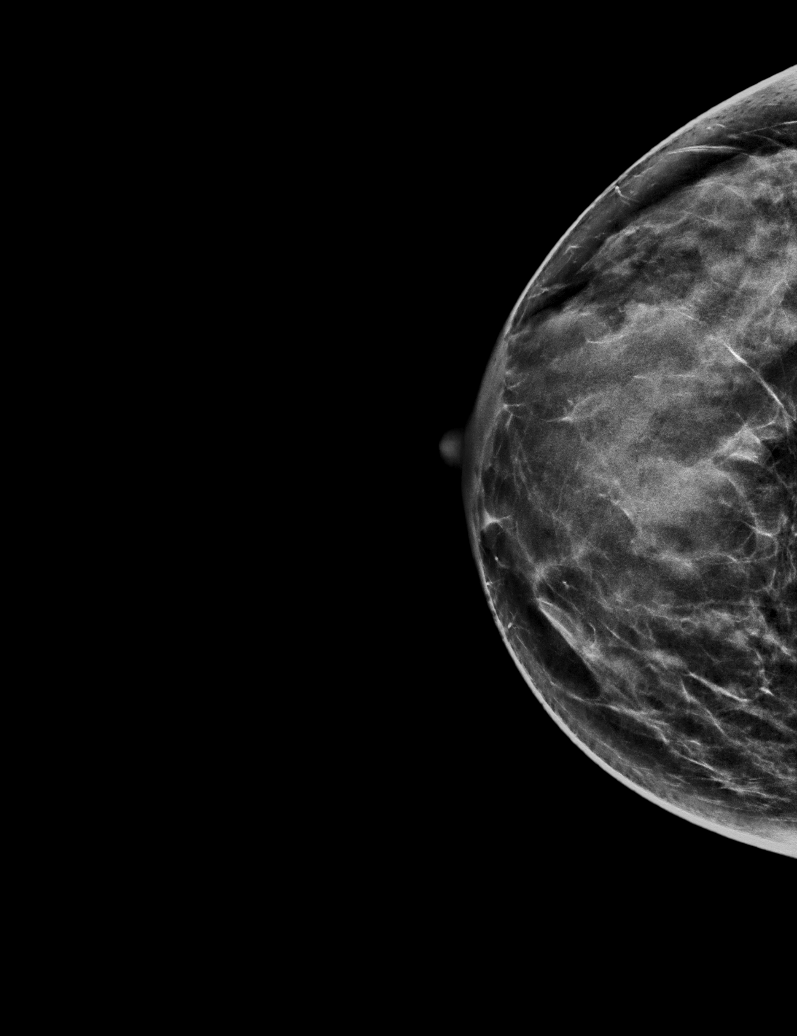

[R CC synth-2D (2 of 2)]
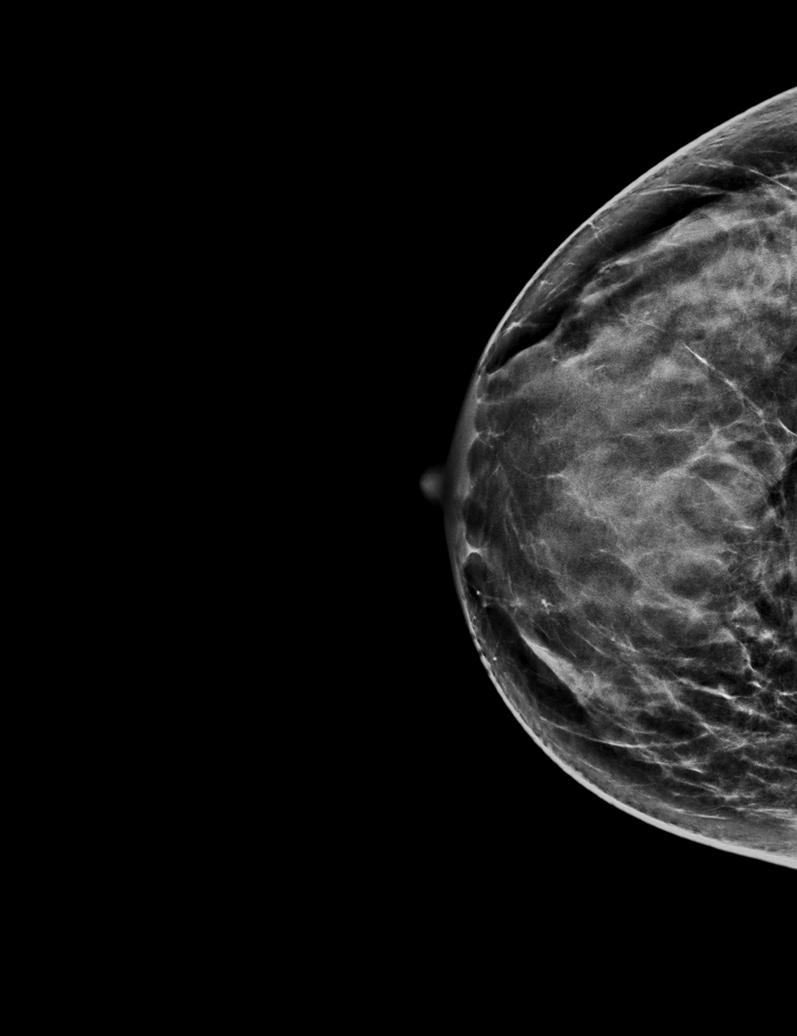

[R MLO synth-2D]
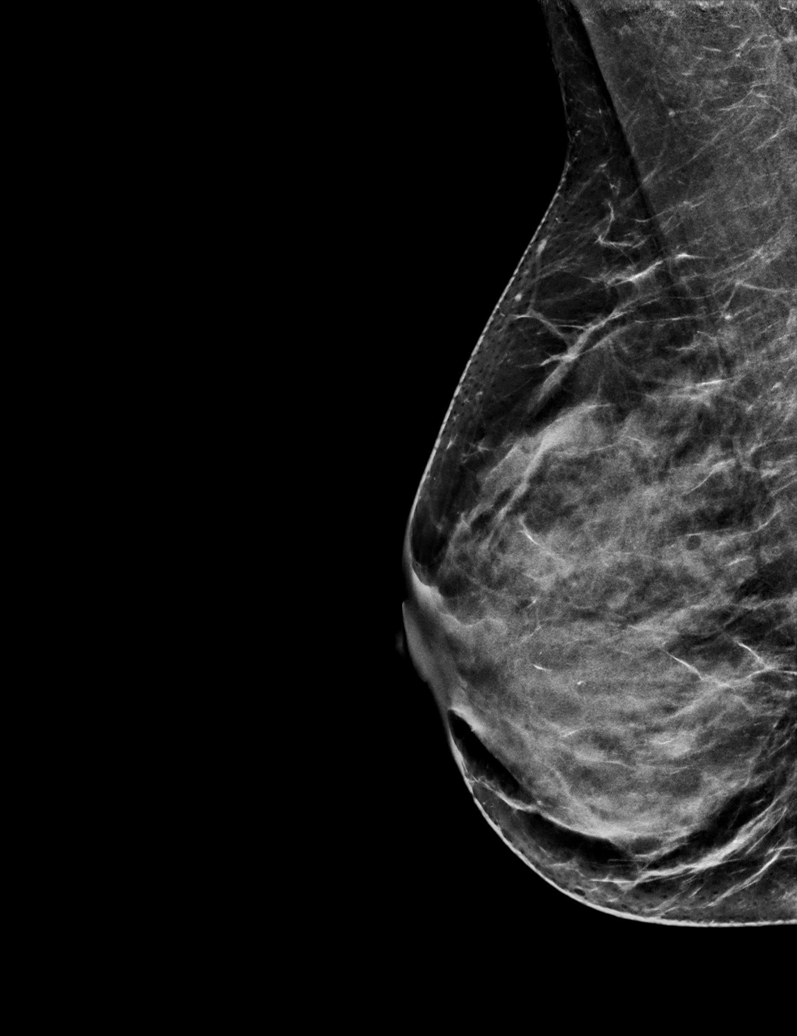

[L MLO synth-2D]
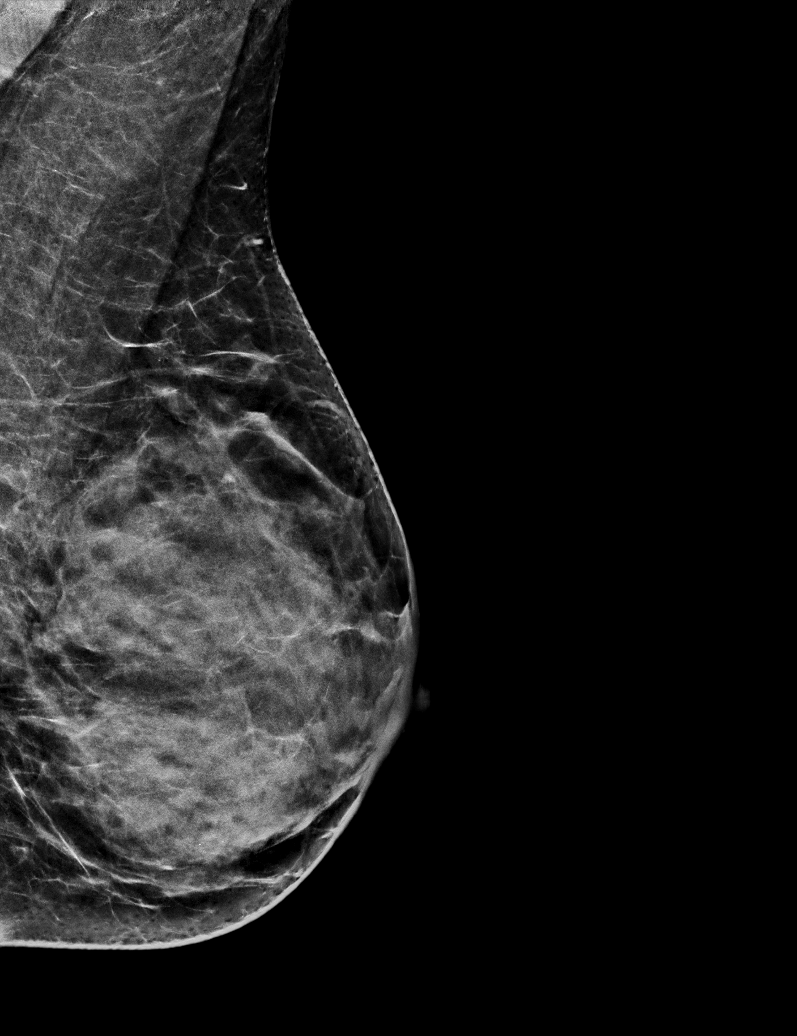

[L CC synth-2D]
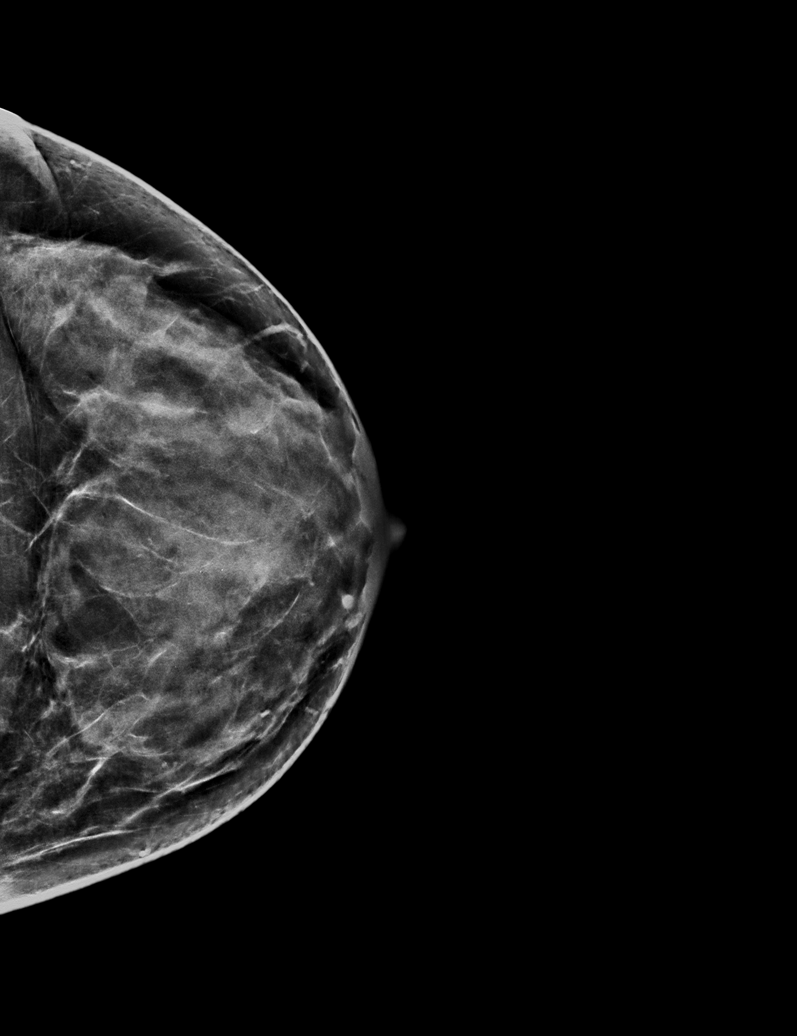

[R CC tomo · tomo slice 32/63.0]
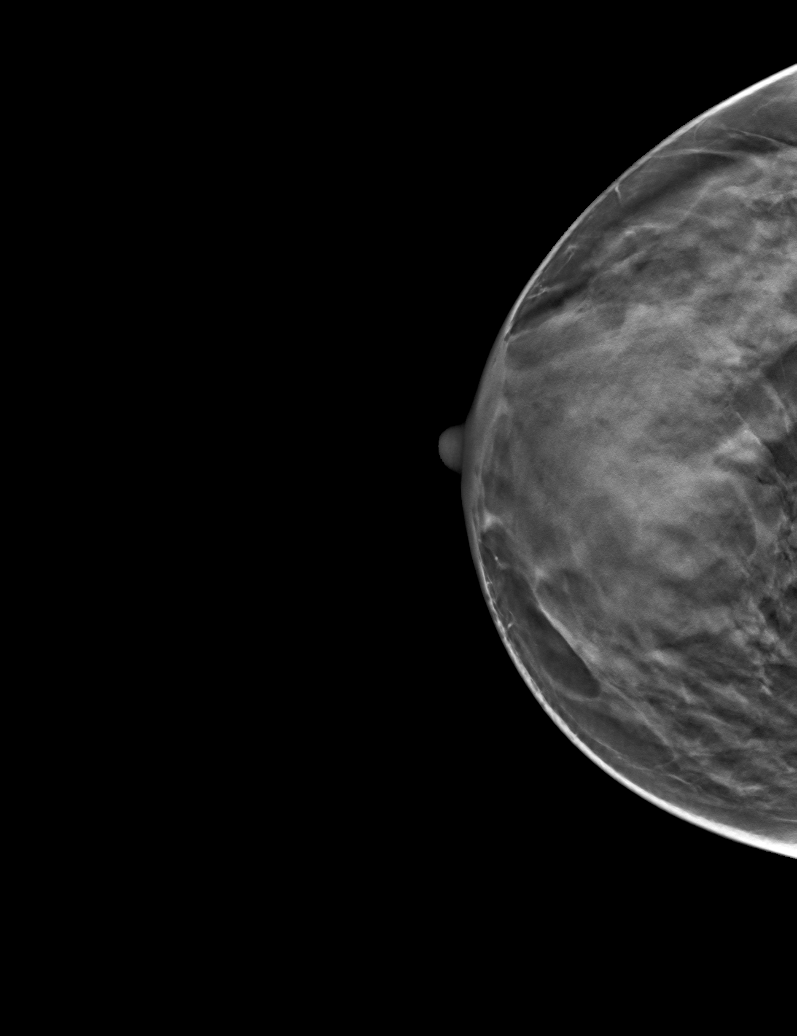

[6 of 30 positions shown; findings below may reference images not displayed]

ACR Breast Density Category d: The breast tissue is extremely dense,
which lowers the sensitivity of mammography
FINDINGS: There are no findings suspicious for malignancy. Images were
processed with CAD.
IMPRESSION: No mammographic evidence of malignancy. A result letter of this
screening mammogram will be mailed directly to the patient.

RECOMMENDATION:
Screening mammogram in one year. (Code:WO-0-ZI0)

BI-RADS CATEGORY  1: Negative.

## 2021-06-22 ENCOUNTER — Encounter: Payer: Self-pay | Admitting: Obstetrics & Gynecology
